# Patient Record
Sex: Male | Born: 1995 | Race: Black or African American | Hispanic: No | Marital: Single | Smoking: Current every day smoker
Health system: Southern US, Community
[De-identification: ages and names within clinical notes are randomized; demographics above are authoritative.]

## PROBLEM LIST (undated history)

## (undated) DIAGNOSIS — R45851 Suicidal ideations: Secondary | ICD-10-CM

## (undated) DIAGNOSIS — F29 Unspecified psychosis not due to a substance or known physiological condition: Secondary | ICD-10-CM

---
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
## 2015-01-30 ENCOUNTER — Emergency Department (HOSPITAL_COMMUNITY)
Admission: EM | Admit: 2015-01-30 | Discharge: 2015-01-30 | Disposition: A | Discharge status: Home / Self Care | Payer: Self-pay | Attending: Emergency Medicine | Admitting: Emergency Medicine

## 2015-01-30 ENCOUNTER — Emergency Department (HOSPITAL_COMMUNITY): Payer: Self-pay

## 2015-01-30 ENCOUNTER — Encounter (HOSPITAL_COMMUNITY): Payer: Self-pay | Admitting: *Deleted

## 2015-01-30 DIAGNOSIS — Y9289 Other specified places as the place of occurrence of the external cause: Secondary | ICD-10-CM | POA: Insufficient documentation

## 2015-01-30 DIAGNOSIS — S63501A Unspecified sprain of right wrist, initial encounter: Secondary | ICD-10-CM | POA: Insufficient documentation

## 2015-01-30 DIAGNOSIS — Z720 Tobacco use: Secondary | ICD-10-CM | POA: Insufficient documentation

## 2015-01-30 DIAGNOSIS — Y9389 Activity, other specified: Secondary | ICD-10-CM | POA: Insufficient documentation

## 2015-01-30 DIAGNOSIS — Y998 Other external cause status: Secondary | ICD-10-CM | POA: Insufficient documentation

## 2015-01-30 DIAGNOSIS — Z792 Long term (current) use of antibiotics: Secondary | ICD-10-CM | POA: Insufficient documentation

## 2015-01-30 DIAGNOSIS — W009XXA Unspecified fall due to ice and snow, initial encounter: Secondary | ICD-10-CM | POA: Insufficient documentation

## 2015-01-30 MED ORDER — HYDROCODONE-ACETAMINOPHEN 5-325 MG PO TABS: ORAL_TABLET | ORAL | Status: DC

## 2015-01-30 NOTE — ED Notes (Signed)
Pt c/o R wrist pain after slipping on ice last night. Reports catching himself with his wrist hyperextended. Swelling noted to R wrist. CMS intact; strong radial pulses

## 2015-01-30 NOTE — ED Provider Notes (Signed)
CSN: [id number]     Arrival date & time 01/30/15  1804 History  This chart was scribed for Wynetta Emery, PA-C, working with Glynn Octave, MD by Chestine Spore, ED Scribe. The patient was seen in room TR09C/TR09C at 6:39 PM.    Chief Complaint  Patient presents with  . Wrist Injury     The history is provided by the patient. No language interpreter was used.    HPI Comments: David [name] is a 19 y.o. male who presents to the Emergency Department complaining of right wrist injury onset last night. He reports that he fell backwards on the ice and he caught himself with his wrist. He reports that he is left handed. He notes that when he moves his fingers there is pain in his wrist. He reports that he is not able to fully extend and rotate his wrist without pain. He notes that when he balls his hand there is pressure in his knuckles. He states that the pain will wake him up at night if he accidentally bumps it.  He states that he has tried acetophenomen with no relief for his symptoms. He denies joint swelling and any other symptoms. He denies being allergic to any medications.    History reviewed. No pertinent past medical history. History reviewed. No pertinent past surgical history. No family history on file. History  Substance Use Topics  . Smoking status: Current Every Day Smoker  . Smokeless tobacco: Not on file  . Alcohol Use: Yes    Review of Systems  A complete 10 system review of systems was obtained and all systems are negative except as noted in the HPI and PMH.    Allergies  Review of patient's allergies indicates no known allergies.  Home Medications   Prior to Admission medications   Medication Sig Start Date End Date Taking? Authorizing Provider  cephALEXin (KEFLEX) 500 MG capsule Take 1 capsule (500 mg total) by mouth 3 (three) times daily. 05/25/13   Arley Phenix, MD   BP 112/71 mmHg  Pulse 63  Temp(Src) 98.6 F (37 C) (Oral)  Resp 18  SpO2  100%  Physical Exam  Constitutional: He is oriented to person, place, and time. He appears well-developed and well-nourished. No distress.  HENT:  Head: Normocephalic and atraumatic.  Eyes: EOM are normal.  Neck: Neck supple. No tracheal deviation present.  Cardiovascular: Normal rate.   Pulmonary/Chest: Effort normal. No respiratory distress.  Musculoskeletal: Normal range of motion. He exhibits no edema or tenderness.  Right wrist with no deformity or swelling, no snuffbox tenderness, excellent range of motion, distally more neurovascularly intact, no overlying skin changes.  Neurological: He is alert and oriented to person, place, and time.  Skin: Skin is warm and dry.  Psychiatric: He has a normal mood and affect. His behavior is normal.  Nursing note and vitals reviewed.   ED Course  Procedures (including critical care time)  SPLINT APPLICATION Date/Time: 2:20 AM Authorized by: Wynetta Emery Consent: Verbal consent obtained. Risks and benefits: risks, benefits and alternatives were discussed Consent given by: patient Splint applied by: RN Location details: right wrist Splint type: thumb Spica Supplies used: Pre-fab splint Post-procedure: The splinted body part was neurovascularly unchanged following the procedure. Patient tolerance: Patient tolerated the procedure well with no immediate complications.    DIAGNOSTIC STUDIES: Oxygen Saturation is 100% on room air, normal by my interpretation.    COORDINATION OF CARE: 6:43 PM-Discussed treatment plan which includes Ice, motrin, Sling, Right wrist  X-ray, Norco with pt at bedside and pt agreed to plan.   Labs Review Labs Reviewed - No data to display  Imaging Review Dg Wrist Complete Right  01/30/2015   CLINICAL DATA:  Ulnar and radial right wrist pain and and swelling in the region of the first metacarpal after stepping on wet concrete stairs and falling with his weight on his right hand last night.  EXAM: RIGHT  WRIST - COMPLETE 3+ VIEW  COMPARISON:  None.  FINDINGS: There is no evidence of fracture or dislocation. There is no evidence of arthropathy or other focal bone abnormality. Soft tissues are unremarkable.  IMPRESSION: Normal examination.   Electronically Signed   By: Beckie SaltsSteven  Reid M.D.   On: 01/30/2015 19:13     EKG Interpretation None      MDM   Final diagnoses:  Right wrist sprain, initial encounter    Filed Vitals:   01/30/15 1808 01/30/15 1928  BP: 112/71 125/58  Pulse: 63 70  Temp: 98.6 F (37 C) 98.6 F (37 C)  TempSrc: Oral Oral  Resp: 18 16  SpO2: 100% 100%    Medications - No data to display  Daisey MustCameron M Catalano is a pleasant 19 y.o. male presenting with wrist pain status post slip and fall last night. Neurovascularly intact with mild tenderness to palpation. X-rays negative. Will treat with rest, ice, compression elevation, NSAIDS.   Evaluation does not show pathology that would require ongoing emergent intervention or inpatient treatment. Pt is hemodynamically stable and mentating appropriately. Discussed findings and plan with patient/guardian, who agrees with care plan. All questions answered. Return precautions discussed and outpatient follow up given.   Discharge Medication List as of 01/30/2015  7:22 PM    START taking these medications   Details  HYDROcodone-acetaminophen (NORCO/VICODIN) 5-325 MG per tablet Take 1-2 tablets by mouth every 6 hours as needed for pain., Print          I personally performed the services described in this documentation, which was scribed in my presence. The recorded information has been reviewed and is accurate.    Wynetta Emeryicole Niall Illes, PA-C 01/31/15 0221  Glynn OctaveStephen Rancour, MD 01/31/15 [phone]

## 2015-01-30 NOTE — Discharge Instructions (Signed)
Rest, Ice intermittently (in the first 24-48 hours), Gentle compression with an Ace wrap, and elevate (Limb above the level of the heart)   Take up to 800mg  of ibuprofen (that is usually 4 over the counter pills)  3 times a day for 5 days. Take with food.  Take vicodin for breakthrough pain, do not drink alcohol, drive, care for children or do other critical tasks while taking vicodin.  Only use the arm sling for up to 2 days. Take the arm out and rotate the shoulder every 4 hours.   Please follow with your primary care doctor in the next 2 days for a check-up. They must obtain records for further management.   Do not hesitate to return to the Emergency Department for any new, worsening or concerning symptoms.   Sprain A sprain is a tear in one of the strong, fibrous tissues that connect your bones (ligaments). The severity of the sprain depends on how much of the ligament is torn. The tear can be either partial or complete. CAUSES  Often, sprains are a result of a fall or an injury. The force of the impact causes the fibers of your ligament to stretch beyond their normal length. This excess tension causes the fibers of your ligament to tear. SYMPTOMS  You may have some loss of motion or increased pain within your normal range of motion. Other symptoms include:  Bruising.  Tenderness.  Swelling. DIAGNOSIS  In order to diagnose a sprain, your caregiver will physically examine you to determine how torn the ligament is. Your caregiver may also suggest an X-ray exam to make sure no bones are broken. TREATMENT  If your ligament is only partially torn, treatment usually involves keeping the injured area in a fixed position (immobilization) for a short period. To do this, your caregiver will apply a bandage, cast, or splint to keep the area from moving until it heals. For a partially torn ligament, the healing process usually takes 2 to 3 weeks. If your ligament is completely torn, you may need  surgery to reconnect the ligament to the bone or to reconstruct the ligament. After surgery, a cast or splint may be applied and will need to stay on for 4 to 6 weeks while your ligament heals. HOME CARE INSTRUCTIONS  Keep the injured area elevated to decrease swelling.  To ease pain and swelling, apply ice to your joint twice a day, for 2 to 3 days.  Put ice in a plastic bag.  Place a towel between your skin and the bag.  Leave the ice on for 15 minutes.  Only take over-the-counter or prescription medicine for pain as directed by your caregiver.  Do not leave the injured area unprotected until pain and stiffness go away (usually 3 to 4 weeks).  Do not allow your cast or splint to get wet. Cover your cast or splint with a plastic bag when you shower or bathe. Do not swim.  Your caregiver may suggest exercises for you to do during your recovery to prevent or limit permanent stiffness. SEEK IMMEDIATE MEDICAL CARE IF:  Your cast or splint becomes damaged.  Your pain becomes worse. MAKE SURE YOU:  Understand these instructions.  Will watch your condition.  Will get help right away if you are not doing well or get worse. Document Released: 11/27/2000 Document Revised: 02/22/2012 Document Reviewed: 12/12/2011 [facility] Patient Information 2015 [place] [place]. This information is not intended to replace advice given to you by your health care provider.  Make sure you discuss any questions you have with your health care provider. ° °

## 2015-01-30 NOTE — ED Notes (Signed)
The pt is c/o rt wrist pain  He fell on the ice last pm  He fell onto his wrist sle swollen

## 2015-07-25 ENCOUNTER — Emergency Department (INDEPENDENT_AMBULATORY_CARE_PROVIDER_SITE_OTHER)
Admission: EM | Admit: 2015-07-25 | Discharge: 2015-07-25 | Disposition: A | Discharge status: Home / Self Care | Payer: Self-pay | Source: Home / Self Care | Attending: Family Medicine | Admitting: Family Medicine

## 2015-07-25 ENCOUNTER — Encounter (HOSPITAL_COMMUNITY): Payer: Self-pay | Admitting: Emergency Medicine

## 2015-07-25 DIAGNOSIS — J301 Allergic rhinitis due to pollen: Secondary | ICD-10-CM

## 2015-07-25 DIAGNOSIS — R0982 Postnasal drip: Secondary | ICD-10-CM

## 2015-07-25 DIAGNOSIS — R638 Other symptoms and signs concerning food and fluid intake: Secondary | ICD-10-CM

## 2015-07-25 DIAGNOSIS — R531 Weakness: Secondary | ICD-10-CM

## 2015-07-25 NOTE — ED Notes (Signed)
C/o sinus States he has a fever, stuffy nose, congestion, ear fullness, headache and vomiting States he has eye pressure Does have yellow mucous congestion Does need doctors note

## 2015-07-25 NOTE — Discharge Instructions (Signed)
Allergic Rhinitis Allegra or Zyrtec daily for drainage Sudafed PE 10 mg every 4 hours for congestion Flonase nasal spray daily Allergic rhinitis is when the mucous membranes in the nose respond to allergens. Allergens are particles in the air that cause your body to have an allergic reaction. This causes you to release allergic antibodies. Through a chain of events, these eventually cause you to release histamine into the blood stream. Although meant to protect the body, it is this release of histamine that causes your discomfort, such as frequent sneezing, congestion, and an itchy, runny nose.  CAUSES  Seasonal allergic rhinitis (hay fever) is caused by pollen allergens that may come from grasses, trees, and weeds. Year-round allergic rhinitis (perennial allergic rhinitis) is caused by allergens such as house dust mites, pet dander, and mold spores.  SYMPTOMS   Nasal stuffiness (congestion).  Itchy, runny nose with sneezing and tearing of the eyes. DIAGNOSIS  Your health care provider can help you determine the allergen or allergens that trigger your symptoms. If you and your health care provider are unable to determine the allergen, skin or blood testing may be used. TREATMENT  Allergic rhinitis does not have a cure, but it can be controlled by:  Medicines and allergy shots (immunotherapy).  Avoiding the allergen. Hay fever may often be treated with antihistamines in pill or nasal spray forms. Antihistamines block the effects of histamine. There are over-the-counter medicines that may help with nasal congestion and swelling around the eyes. Check with your health care provider before taking or giving this medicine.  If avoiding the allergen or the medicine prescribed do not work, there are many new medicines your health care provider can prescribe. Stronger medicine may be used if initial measures are ineffective. Desensitizing injections can be used if medicine and avoidance does not work.  Desensitization is when a patient is given ongoing shots until the body becomes less sensitive to the allergen. Make sure you follow up with your health care provider if problems continue. HOME CARE INSTRUCTIONS It is not possible to completely avoid allergens, but you can reduce your symptoms by taking steps to limit your exposure to them. It helps to know exactly what you are allergic to so that you can avoid your specific triggers. SEEK MEDICAL CARE IF:   You have a fever.  You develop a cough that does not stop easily (persistent).  You have shortness of breath.  You start wheezing.  Symptoms interfere with normal daily activities. Document Released: 08/25/2001 Document Revised: 12/05/2013 Document Reviewed: 08/07/2013 [facility] Patient Information 2015 [place], [place]. This information is not intended to replace advice given to you by your health care provider. Make sure you discuss any questions you have with your health care provider.  Weakness Weakness is a lack of strength. It may be felt all over the body (generalized) or in one specific part of the body (focal). Some causes of weakness can be serious. You may need further medical evaluation, especially if you are elderly or you have a history of immunosuppression (such as chemotherapy or HIV), kidney disease, heart disease, or diabetes. CAUSES  Weakness can be caused by many different things, including:  Infection.  Physical exhaustion.  Internal bleeding or other blood loss that results in a lack of red blood cells (anemia).  Dehydration. This cause is more common in elderly people.  Side effects or electrolyte abnormalities from medicines, such as pain medicines or sedatives.  Emotional distress, anxiety, or depression.  Circulation problems, especially severe  peripheral arterial disease.  Heart disease, such as rapid atrial fibrillation, bradycardia, or heart failure.  Nervous system disorders, such as  Guillain-Barr syndrome, multiple sclerosis, or stroke. DIAGNOSIS  To find the cause of your weakness, your caregiver will take your history and perform a physical exam. Lab tests or X-rays may also be ordered, if needed. TREATMENT  Treatment of weakness depends on the cause of your symptoms and can vary greatly. HOME CARE INSTRUCTIONS   Rest as needed.  Eat a well-balanced diet.  Try to get some exercise every day.  Only take over-the-counter or prescription medicines as directed by your caregiver. SEEK MEDICAL CARE IF:   Your weakness seems to be getting worse or spreads to other parts of your body.  You develop new aches or pains. SEEK IMMEDIATE MEDICAL CARE IF:   You cannot perform your normal daily activities, such as getting dressed and feeding yourself.  You cannot walk up and down stairs, or you feel exhausted when you do so.  You have shortness of breath or chest pain.  You have difficulty moving parts of your body.  You have weakness in only one area of the body or on only one side of the body.  You have a fever.  You have trouble speaking or swallowing.  You cannot control your bladder or bowel movements.  You have black or bloody vomit or stools. MAKE SURE YOU:  Understand these instructions.  Will watch your condition.  Will get help right away if you are not doing well or get worse. Document Released: 11/30/2005 Document Revised: 05/31/2012 Document Reviewed: 01/29/2012 [facility] Patient Information 2015 [place], [place]. This information is not intended to replace advice given to you by your health care provider. Make sure you discuss any questions you have with your health care provider.

## 2015-07-25 NOTE — ED Provider Notes (Signed)
CSN: [id number]     Arrival date & time 07/25/15  1259 History   First MD Initiated Contact with Patient 07/25/15 1319     Chief Complaint  Patient presents with  . Sinusitis   (Consider location/radiation/quality/duration/timing/severity/associated sxs/prior Treatment) HPI Comments: 19 year old male states that he had a subjective fever last p.m. He states he felt extremely hot but did not take his temperature. Nor did he take any medicine for perceived fever. He has had a dry cough, has vomited twice in the past 24 hours, as had a headache, eyes over the frontal bitemporal areas. Yesterday while at graduation he was feeling weak and had blurred vision and was difficult for him to stand up. He states that he now has a headache his eyes are. The days prior to yesterday he has been working in a hot environment performing repetitive task. It is questionable as to whether he has been keeping up with his fluid intake.   History reviewed. No pertinent past medical history. History reviewed. No pertinent past surgical history. History reviewed. No pertinent family history. Social History  Substance Use Topics  . Smoking status: Current Every Day Smoker  . Smokeless tobacco: None  . Alcohol Use: Yes    Review of Systems  Constitutional: Positive for fever, chills, activity change and fatigue.  HENT: Positive for congestion. Negative for ear pain, postnasal drip, rhinorrhea, sore throat and trouble swallowing.   Eyes: Negative for photophobia.       Pain behind the eyes. No problems with vision.  Respiratory: Positive for cough. Negative for choking, chest tightness, shortness of breath and wheezing.   Cardiovascular: Negative for chest pain.       Sensation of heart racing at times.  Gastrointestinal: Positive for vomiting. Negative for abdominal pain.  Genitourinary: Negative.   Musculoskeletal: Negative.   Neurological: Positive for dizziness, weakness and headaches. Negative for syncope.   Psychiatric/Behavioral: Negative.     Allergies  Review of patient's allergies indicates no known allergies.  Home Medications   Prior to Admission medications   Medication Sig Start Date End Date Taking? Authorizing Provider  cephALEXin (KEFLEX) 500 MG capsule Take 1 capsule (500 mg total) by mouth 3 (three) times daily. 05/25/13   Marcellina Millin, MD  HYDROcodone-acetaminophen (NORCO/VICODIN) 5-325 MG per tablet Take 1-2 tablets by mouth every 6 hours as needed for pain. 01/30/15   Nicole Pisciotta, PA-C   BP 116/73 mmHg  Pulse 97  Temp(Src) 99.7 F (37.6 C) (Oral)  Resp 18  SpO2 96% Physical Exam  Constitutional: He is oriented to person, place, and time. He appears well-developed and well-nourished. No distress.  Patient does not appear toxic or especially ill. His movements and speech are energetic and strong. Well coordinated.  HENT:  Head: Normocephalic and atraumatic.  Bilateral TMs are normal Oropharynx with minor erythema, copious amount of clear PND and much cobblestoning. No exudates. Airway widely patent.  Eyes: Conjunctivae and EOM are normal. Pupils are equal, round, and reactive to light.  Neck: Normal range of motion. Neck supple.  Cardiovascular: Normal rate, regular rhythm, normal heart sounds and intact distal pulses.   Pulmonary/Chest: Effort normal and breath sounds normal. No respiratory distress. He has no wheezes. He has no rales.  Abdominal: Soft. He exhibits no distension. There is no tenderness.  Musculoskeletal: Normal range of motion. He exhibits no edema or tenderness.  Lymphadenopathy:    He has no cervical adenopathy.  Neurological: He is alert and oriented to person, place, and time.  He exhibits normal muscle tone.  Skin: Skin is warm and dry. He is not diaphoretic.  Psychiatric: He has a normal mood and affect.  Nursing note and vitals reviewed.   ED Course  Procedures (including critical care time) Labs Review Labs Reviewed - No data to  display  Imaging Review No results found.   MDM   1. Allergic rhinitis due to pollen   2. PND (post-nasal drip)   3. Symptoms of dehydration   4. Weakness    think most of this is due to allergic rhinitis symptoms, congestion and non-bacterial sinusitis. Also working in a hot environment and becoming dehydrated without sufficient rehydration. We will give him a couple days off and have him rest, drink any fluids intake medicines for his congestion and drainage.  Allegra or Zyrtec daily for drainage Sudafed PE 10 mg every 4 hours for congestion Flonase nasal spray daily Drink lots of fluids, gatorade, water, rest, stay in cool environment. Ibuprofen or Tylenol for subjective fevers or headache or pressure in the face.     Hayden Rasmussen, NP 07/25/15 1353

## 2015-10-18 ENCOUNTER — Encounter (HOSPITAL_COMMUNITY): Payer: Self-pay

## 2015-10-18 ENCOUNTER — Emergency Department (HOSPITAL_COMMUNITY): Payer: Self-pay

## 2015-10-18 ENCOUNTER — Emergency Department (HOSPITAL_COMMUNITY)
Admission: EM | Admit: 2015-10-18 | Discharge: 2015-10-18 | Disposition: A | Discharge status: Home / Self Care | Payer: Self-pay | Attending: Emergency Medicine | Admitting: Emergency Medicine

## 2015-10-18 DIAGNOSIS — Z720 Tobacco use: Secondary | ICD-10-CM | POA: Insufficient documentation

## 2015-10-18 DIAGNOSIS — M24419 Recurrent dislocation, unspecified shoulder: Secondary | ICD-10-CM

## 2015-10-18 DIAGNOSIS — M24411 Recurrent dislocation, right shoulder: Secondary | ICD-10-CM | POA: Insufficient documentation

## 2015-10-18 NOTE — Discharge Instructions (Signed)
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 See orthopedic surgery within 3 days for close follow-up.  Take ibuprofen as needed for pain.If any symptoms worsen come back to the emergency department immediately. Thank you. Anterior shoulder instability is a condition that is characterized by recurrent dislocation of the shoulder joint. Dislocation is an injury in which two adjacent bones are no longer in proper alignment, and the joint surfaces are no longer touching. Subluxation is a similar injury to dislocation; however, the joint surfaces are still touching. Dislocations and subluxations of the shoulder joint (glenohumeral) most commonly involve the upper arm bone (humerus) displacing forward (anteriorly). The shoulder joint allows more motion than any other joint in the body, and because of this it is highly susceptible to injury. When the glenohumeral joint is dislocated or subluxated, the muscles that control the shoulder joint (rotator cuff) tendons become stretched. Repetitive injury results in the shoulder joint becoming loose and results in instability of the shoulder joint. These injuries may also cause a tear in the cartilage that lines the joint and helps keep the humerus head in place (labrum). SYMPTOMS   Severe shoulder pain when the joint is dislocated or subluxated.  Shoulder weakness, pain, and/or inflammation.  Loss of shoulder function.  Pain that worsens with shoulder function, especially motions that involve arm movements above shoulder height.  Feeling of shoulder weakness or instability.  Signs of nerve damage: numbness or paralysis.  Crackling (crepitation) feeling and sound when the injured area is touched or with shoulder motion. CAUSES  Anterior shoulder instability is caused by injury to the glenohumeral joint that causes it to become dislocated or subluxated. Common mechanisms of injury  include:  Direct trauma to the shoulder joint.  Repetitive and/or strenuous movements of the shoulder joint, especially those with the arm above shoulder height.  Sprain of one of the ligaments of the shoulder joint.  An abnormality you are born with (congenital). This includes a shallow or malformed joint surface. RISK INCREASES WITH:  Contact sports (football, wrestling, and basketball).  Activities that involve repetitive and/or strenuous movements of the shoulder joint, especially those with the arm above shoulder height (baseball, volleyball, or swimming).  Previous shoulder injury.  Poor strength and flexibility. PREVENTION  Warm up and stretch properly before activity.  Allow for adequate recovery between workouts.  Maintain physical fitness:  Strength, flexibility, and endurance.  Cardiovascular fitness.  Learn and use proper technique. When possible, have a coach correct improper technique.  Wear properly fitted and padded protective equipment. PROGNOSIS  The extent of recovery and likelihood of future dislocations and subluxations depends on the extent of damage done to the shoulder.  RELATED COMPLICATIONS   Damage to the nervous system or blood vessels that may cause weakness, paralysis, numbness, coldness, and paleness.  Damage to the bones or cartilage of the shoulder joint.  Permanent shoulder instability.  Tear of one or more of the rotator cuff tendons.  Arthritis of the shoulder. TREATMENT When the shoulder joint is dislocated it must be realigned (reduced) by someone who is trained in the procedure. Occasionally reduction cannot be performed manually, and requires surgery. After reduction, the use of ice and medication may help reduce pain and inflammation. The shoulder should be immobilized with a sling for 3 to 8 weeks to allow the joint to heal. After immobilization, it is important to perform strengthening and stretching exercises to help regain  strength and  a full range of motion. These exercises may be completed at home or with a therapist. Surgery is reserved for individuals who have sustained multiple shoulder dislocations due to shoulder instability. MEDICATION   General anesthesia or muscle relaxants may be necessary for reduction of the shoulder joint.  If pain medication is necessary, then nonsteroidal anti-inflammatory medications, such as aspirin and ibuprofen, or other minor pain relievers, such as acetaminophen, are often recommended.  Do not take pain medication within 7 days before surgery.  Prescription pain relievers may be given if deemed necessary by your caregiver. Use only as directed and only as much as you need. COLD THERAPY  Cold treatment (icing) relieves pain and reduces inflammation. Cold treatment should be applied for 10 to 15 minutes every 2 to 3 hours for inflammation and pain and immediately after any activity that aggravates your symptoms. Use ice packs or an ice massage. SEEK MEDICAL CARE IF:  Treatment seems to offer no benefit, or the condition worsens.  Any medications produce adverse side effects.  Any complications from surgery occur:  Pain, numbness, or coldness in the extremity operated upon.  Discoloration of the nail beds (they become blue or gray) of the extremity operated upon.  Signs of infections (fever, pain, inflammation, redness, or persistent bleeding). EXERCISES RANGE OF MOTION (ROM) AND STRETCHING EXERCISES - Shoulder Instability, Anterior These exercises may help you restore your shoulder mobility once your physician has discontinued your 2-6 weeks of immobilization. Beginning these before your provider's approval may result in delayed healing. Your symptoms may resolve with or without further involvement from your physician, physical therapist or athletic trainer. While completing these exercises, remember:   Restoring tissue flexibility helps normal motion to return to the  joints. This allows healthier, less painful movement and activity.  An effective stretch should be held for at least 30 seconds. A stretch should never be painful. You should only feel a gentle lengthening or release in the stretch.  During your recovery, avoid activity or exercises which involve actions that place your right / left hand or elbow above your head or behind your back or head. These positions stress the tissues which are trying to heal. ROM - Pendulum  Bend at the waist so that your right / left arm falls away from your body. Support yourself with your opposite hand on a solid surface, such as a table or a countertop.  Your right / left arm should be perpendicular to the ground. If it is not perpendicular, you need to lean over farther. Relax the muscles in your right / left arm and shoulder as much as possible.  Gently sway your hips and trunk so they move your arm without any use of your right / left shoulder muscles.  Progress your movements so that your arm moves side to side, then forward and backward, and finally, both clockwise and counterclockwise.  Complete __________ repetitions in each direction. Many people use this exercise to relieve discomfort in their shoulder as well as to gain range of motion. Repeat __________ times. Complete this exercise __________ times per day. STRETCH - Flexion, Seated  Sit in a firm chair so that your right / left forearm can rest on a table or on a table or countertop. Your elbow should rest below the height of your shoulder so that your shoulder feels supported and not tense or uncomfortable.  Keeping your right / left shoulder relaxed, lean forward at your waist, allowing your hand to slide forward. Bend forward  until you feel a moderate stretch in your shoulder, but before you feel an increase in your pain.  Hold __________ seconds. Slowly return to your starting position. Repeat __________ times. Complete this exercise __________  times per day.  STRETCH - Flexion, Standing  Stand with good posture. With an underhand grip on your right / left hand and an overhand grip on the opposite hand, grasp a broomstick or cane so that your hands are a little more than shoulder-width apart.  Keeping your right / left elbow straight and shoulder muscles relaxed, push the stick with your opposite hand to raise your arm in front of your body and then overhead. Raise your arm until you feel a stretch in your right / left shoulder, but before you have increased shoulder pain.  Try to avoid shrugging your shoulder as your arm rises by keeping your shoulder blade tucked down and toward your mid-back spine. Hold __________ seconds.  Slowly return to the starting position. Repeat __________ times. Complete this exercise __________ times per day.  STRETCH - Abduction, Supine  Stand with good posture. With an underhand grip on your right / left hand and an overhand grip on the opposite hand, grasp a broomstick or cane so that your hands are a little more than shoulder-width apart.  Keeping your right / left elbow straight and shoulder muscles relaxed, push the stick with your opposite hand to raise your right / left arm out to the side of your body and then overhead. Raise your arm until you feel a stretch in your shoulder, but before you have increased shoulder pain.  Try to avoid shrugging your shoulder as your arm rises by keeping your shoulder blade tucked down and toward your mid-back spine. Hold __________ seconds.  Slowly return to the starting position. Repeat __________ times. Complete this exercise __________ times per day.  ROM - Flexion, Active-Assisted  Lie on your back. You may bend your knees for comfort.  Grasp a broomstick or cane so your hands are about shoulder-width apart. Your right / left hand should grip the end of the stick/cane so that your hand is positioned "thumbs-up," as if you were about to shake  hands.  Using your healthy arm to lead, raise your right / left arm overhead until you feel a gentle stretch in your shoulder. Hold __________ seconds.  Use the stick/cane to assist in returning your arm to its starting position. Repeat __________ times. Complete this exercise __________ times per day.  STRETCH - Flexion, Standing  Stand facing a wall. Walk your right / left fingers up the wall until you feel a moderate stretch in your shoulder. As your hand gets higher, you may need to step closer to the wall or use a door frame to walk through.  Try to avoid shrugging your shoulder as your arm rises by keeping your shoulder blade tucked down and toward your mid-back spine.  Hold __________ seconds. Use your other hand, if needed, to ease out of the stretch and return to the starting position. Repeat __________ times. Complete this exercise __________ times per day.  STRETCH - External Rotation  Tuck a folded towel or small ball under your right / left upper arm. Grasp a broomstick or cane with an underhand grasp a little more than shoulder width apart. Bend your elbows to 90 degrees.  Stand with good posture or sit in a chair without arms.  Use your strong arm to push the stick across your body. Do not  allow the towel or ball to fall. This will rotate your right / left arm away from your abdomen. Using the stick turn/rotate your hand and forearm away from your body. Hold __________ seconds. Repeat __________ times. Complete this exercise __________ times per day.  STRENGTHENING EXERCISES - Shoulder Instability, Anterior These exercises may help you restore your shoulder strength once your physician has discontinued your 2-6 weeks of immobilization. When completing these exercises, do not raise your arm above shoulder-height until your physician, physical therapist or athletic trainer has instructed you to do so. Advancing your exercise before your provider's approval may result in delayed  healing. While completing these exercises, remember:   Muscles can gain both the endurance and the strength needed for everyday activities through controlled exercises.  Complete these exercises as instructed by your physician, physical therapist or athletic trainer. Progress the resistance and repetitions only as guided.  You may experience muscle soreness or fatigue, but the pain or discomfort you are trying to eliminate should never worsen during these exercises. If this pain does worsen, stop and make certain you are following the directions exactly. If the pain is still present after adjustments, discontinue the exercise until you can discuss the trouble with your clinician.  During your recovery, avoid activity or exercises which involve actions that place your right / left hand or elbow above your head or behind your back or head. These positions stress the tissues which are trying to heal. STRENGTH - Scapular Depression and Adduction   With good posture, sit on a firm chair. Supported your arms in front of you with pillows, arm rests or a table top. Have your elbows in line with the sides of your body.  Gently draw your shoulder blades down and toward your mid-back spine. Gradually increase the tension without tensing the muscles along the top of your shoulders and the back of your neck.  Hold for __________ seconds. Slowly release the tension and relax your muscles completely before completing the next repetition.  After you have practiced this exercise, remove the arm support and complete it in standing as well as sitting. Repeat __________ times. Complete this exercise __________ times per day.  STRENGTH - Shoulder Flexion, Isometric  With good posture and facing a wall, stand or sit about 4-6 inches away.  Keeping your right / left elbow straight, gently press the top of your fist into the wall. Increase the pressure gradually until you are pressing as hard as you can without  shrugging your shoulder or increasing any shoulder discomfort.  Hold __________ seconds.  Release the tension slowly. Relax your shoulder muscles completely before you the next repetition. Repeat __________ times. Complete this exercise __________ times per day.  STRENGTH - Shoulder Abductors, Isometric  With good posture, stand or sit about 4-6 inches from a wall with your right / left side facing the wall.  Bend your right / left elbow. Gently press your elbow into the wall. Increase the pressure gradually until you are pressing as hard as you can without shrugging your shoulder or increasing any shoulder discomfort.  Hold __________ seconds.  Release the tension slowly. Relax your shoulder muscles completely before you the next repetition. Repeat __________ times. Complete this exercise __________ times per day.  STRENGTH - Internal Rotators, Isometric  Keep your right / left elbow at your side and bend it 90 degrees.  Step into a door frame so that the inside of your right / left wrist can press against the door  frame without your upper arm leaving your side.  Gently press your right / left wrist into the door frame as if you were trying to draw the palm of your hand to your abdomen. Gradually increase the tension until you are pressing as hard as you can without shrugging your shoulder or increasing any shoulder discomfort.  Hold __________ seconds.  Release the tension slowly. Relax your shoulder muscles completely before you the next repetition. Repeat __________ times. Complete this exercise __________ times per day.  STRENGTH - External Rotators, Isometric  Keep your right / left elbow at your side and bend it 90 degrees.  Step into a door frame so that the outside of your right / left wrist can press against the door frame without your upper arm leaving your side.  Gently press your right / left wrist into the door frame as if you were trying to swing the back of your hand  away from your abdomen. Gradually increase the tension until you are pressing as hard as you can without shrugging your shoulder or increasing any shoulder discomfort.  Hold __________ seconds.  Release the tension slowly. Relax your shoulder muscles completely before you the next repetition. Repeat __________ times. Complete this exercise __________ times per day.  STRENGTH - Scapular Protractors, Standing  Stand arms-length away from a wall. Place your hands on the wall, keeping your elbows straight.  Begin by dropping your shoulder blades down and toward your mid-back spine.  To strengthen your protractors, keep your shoulder blades down, but slide them forward on your rib cage. It will feel as if you are lifting the back of your rib cage away from the wall. This is a subtle motion and can be challenging to complete. Ask your clinician for further instruction if you are not sure you are doing the exercise correctly.  Hold for __________ seconds. Slowly return to the starting position, resting the muscles completely before completing the next repetition. Repeat __________ times. Complete this exercise __________ times per day. STRENGTH - Scapular Protractors, Supine  Lie on your back on a firm surface. Extend your right / left arm straight into the air while holding a __________ weight in your hand.  Keeping your head and back in place, lift your shoulder off the floor.  Hold __________ seconds. Slowly return to the starting position and allow your muscles to relax completely before completing the next repetition. Repeat __________ times. Complete this exercise __________ times per day. STRENGTH - Scapular Protractors, Quadruped  Get onto your hands and knees with your shoulders directly over your hands (or as close as you comfortably can be).  Keeping your elbows locked, lift the back of your rib cage up into your shoulder blades so your mid-back rounds-out. Keep your neck muscles  relaxed.  Hold this position for __________ seconds. Slowly return to the starting position and allow your muscles to relax completely before completing the next repetition. Repeat __________ times. Complete this exercise __________ times per day.  STRENGTH - Shoulder Extensors, Prone  Lie on your stomach on a firm surface so that your right / left arm overhangs the edge. Rest your forehead on your opposite forearm. With your thumb facing away from your body and your elbow straight, hold a __________ weight in your hand.  Squeeze your right / left shoulder blade to your mid-back spine and then slowly raise your arm behind you to the height of the bed.  Hold for __________ seconds. Slowly reverse the directions and  return to the starting position, controlling the weight as you lower your arm. Repeat __________ times. Complete this exercise __________ times per day.  STRENGTH - Shoulder Flexion  Stand or sit with good posture. Grasp a __________ weight or an exercise band/tubing so that your hand is "thumbs-up," like when you shake hands.  Slowly lift your right / left arm as far as you can without increasing any shoulder pain. Initially, many people can only raise their hand to shoulder height.  Avoid shrugging your shoulder as your arm rises by keeping your shoulder blade tucked down and toward your mid-back spine.  Hold for __________ seconds. Control the descent of your hand as you slowly return to your starting position. Repeat __________ times. Complete this exercise __________ times per day.  STRENGTH - Scapular Retractors  Secure a rubber exercise band/tubing so that it is at the height of your shoulders when you are either standing or sitting on a firm arm-less chair.  With a palm-down grip, grasp an end of the band/tubing in each hand. Straighten your elbows and lift your hands straight in front of you at shoulder height. Step back away from the secured end of band/tubing until it  becomes tense.  Squeezing your shoulder blades together, draw your elbows back as you bend them. Keep your upper arm lifted away from your body throughout the exercise.  Hold __________ seconds. Slowly ease the tension on the band/tubing as you reverse the directions and return to the starting position. Repeat __________ times. Complete this exercise __________ times per day. STRENGTH - Internal Rotators  Secure a rubber exercise band/tubing to a fixed object so that it is at the same height as your right / left elbow when you are standing or sitting on a firm surface.  Stand or sit so that the secured exercise band/tubing is at your right / left side.  Bend your elbow 90 degrees. Place a folded towel or small pillow under your right / left arm so that your elbow is a few inches away from your side.  Keeping the tension on the exercise band/tubing, pull it across your body toward your abdomen. Be sure to keep your body steady so that the movement is only coming from your shoulder rotating.  Hold __________ seconds. Release the tension in a controlled manner as you return to the starting position. Repeat __________ times. Complete this exercise __________ times per day.  STRENGTH - External Rotators  Secure a rubber exercise band/tubing to a fixed object so that it is at the same height as your right / left elbow when you are standing or sitting on a firm surface.  Stand or sit so that the secured exercise band/tubing is at your side that is not injured.  Bend your elbow 90 degrees. Place a folded towel or small pillow under your right / left arm so that your elbow is a few inches away from your side.  Keeping the tension on the exercise band/tubing, pull it away from your body, as if pivoting on your elbow. Be sure to keep your body steady so that the movement is only coming from your shoulder rotating.  Hold __________ seconds. Release the tension in a controlled manner as you return to  the starting position. Repeat __________ times. Complete this exercise __________ times per day.    This information is not intended to replace advice given to you by your health care provider. Make sure you discuss any questions you have with your health  care provider.   Document Released: 11/30/2005 Document Revised: 08/21/2015 Document Reviewed: 03/14/2009 [employer] Interactive Patient Education [employer]

## 2015-10-18 NOTE — ED Provider Notes (Signed)
CSN: [id number]   Arrival date & time 10/18/15 0159  History  By signing my name below, I, [name], attest that this documentation has been prepared under the direction and in the presence of Tomasita Crumble, MD. Electronically Signed: [name], ED Scribe. 10/18/2015. 2:40 AM.  Chief Complaint  Patient presents with  . Shoulder Injury    HPI The history is provided by the patient. No language interpreter was used.   David [name] is a 19 y.o. male who presents to the Emergency Department complaining of constant right shoulder pain with onset tonight around 8 PM while helping a friend move furniture. He states that the shoulder appeared to be "slouched over" and dislocated. He manipulated the joint and believes that it is back in place but the pain has persisted. He states that both of his shoulders frequently (at least once per month) dislocate. At present he rates the right shoulder pain 7/10 in severity and describes it as aching.   History reviewed. No pertinent past medical history.  History reviewed. No pertinent past surgical history.  No family history on file.  Social History  Substance Use Topics  . Smoking status: Current Every Day Smoker  . Smokeless tobacco: None  . Alcohol Use: Yes     Review of Systems  Musculoskeletal:       Right shoulder pain   10 Systems reviewed and all are negative for acute change except as noted in the HPI. Home Medications   Prior to Admission medications   Not on File    Allergies  Review of patient's allergies indicates no known allergies.  Triage Vitals: BP 141/62 mmHg  Pulse 83  Temp(Src) 97.9 F (36.6 C) (Oral)  Resp 18  SpO2 98%  Physical Exam  Constitutional: He is oriented to person, place, and time. Vital signs are normal. He appears well-developed and well-nourished.  Non-toxic appearance. He does not appear ill. No distress.  HENT:  Head: Normocephalic and atraumatic.  Nose: Nose normal.  Mouth/Throat:  Oropharynx is clear and moist. No oropharyngeal exudate.  Eyes: Conjunctivae and EOM are normal. Pupils are equal, round, and reactive to light. No scleral icterus.  Neck: Normal range of motion. Neck supple. No tracheal deviation, no edema, no erythema and normal range of motion present. No thyroid mass and no thyromegaly present.  Cardiovascular: Normal rate, regular rhythm, S1 normal, S2 normal, normal heart sounds, intact distal pulses and normal pulses.  Exam reveals no gallop and no friction rub.   No murmur heard. Pulses:      Radial pulses are 2+ on the right side, and 2+ on the left side.       Dorsalis pedis pulses are 2+ on the right side, and 2+ on the left side.  Pulmonary/Chest: Effort normal and breath sounds normal. No respiratory distress. He has no wheezes. He has no rhonchi. He has no rales.  Abdominal: Soft. Normal appearance and bowel sounds are normal. He exhibits no distension, no ascites and no mass. There is no hepatosplenomegaly. There is no tenderness. There is no rebound, no guarding and no CVA tenderness.  Musculoskeletal: Normal range of motion. He exhibits no edema or tenderness.  FROM at bilateral shoulders Normal pulses and sensations distally  Lymphadenopathy:    He has no cervical adenopathy.  Neurological: He is alert and oriented to person, place, and time. He has normal strength. No cranial nerve deficit or sensory deficit.  Skin: Skin is warm, dry and intact. No petechiae and  no rash noted. He is not diaphoretic. No erythema. No pallor.  Psychiatric: He has a normal mood and affect. His behavior is normal. Judgment normal.  Nursing note and vitals reviewed.   ED Course  Procedures   DIAGNOSTIC STUDIES: Oxygen Saturation is 98% on RA, normal by my interpretation.    COORDINATION OF CARE: 2:38 AM Discussed treatment plan which includes right shoulder XR with pt at bedside and pt agreed to plan.  Labs Reviewed - No data to display  Imaging Review  Dg Shoulder Right  10/18/2015  CLINICAL DATA:  Injury while moving a couch. Right shoulder gave out. Possible dislocation. EXAM: RIGHT SHOULDER - 2+ VIEW COMPARISON:  None. FINDINGS: There is no evidence of fracture or dislocation. There is no evidence of arthropathy or other focal bone abnormality. Soft tissues are unremarkable. IMPRESSION: Negative. Electronically Signed   By: Burman NievesWilliam  Stevens M.D.   On: 10/18/2015 02:35    I personally reviewed and evaluated these images as a part of my medical decision-making.   MDM   Final diagnoses:  Recurrent shoulder dislocation, unspecified laterality   Patient presents to the ED to ask what he can take for shoulder pain and how to prevent shoulder dislocations.  Education provided and DC instructions include exercises.  In addition, he has been given ortho follow up for possible surgery.  Advised on motrin and tylenol.  He appears well and in NAD.  VS remain within his normal limits and he is safe for DC.   I personally performed the services described in this documentation, which was scribed in my presence. The recorded information has been reviewed and is accurate.     Tomasita CrumbleAdeleke Bernardo Brayman, MD 10/18/15 1524

## 2015-10-18 NOTE — ED Notes (Addendum)
Pt reports right shoulder pain after helping friend move furniture last night around 8pm. Pt states he thought it may had been dislocated at one point but states he thinks he popped it back in place. Hx of shoulder dislocations.

## 2015-10-18 NOTE — ED Notes (Signed)
MD at bedside. 

## 2016-02-28 ENCOUNTER — Emergency Department (HOSPITAL_COMMUNITY)
Admission: EM | Admit: 2016-02-28 | Discharge: 2016-03-03 | Payer: Self-pay | Attending: Emergency Medicine | Admitting: Emergency Medicine

## 2016-02-28 ENCOUNTER — Inpatient Hospital Stay (HOSPITAL_COMMUNITY): Admission: AD | Admit: 2016-02-28 | Payer: Federal, State, Local not specified - Other | Admitting: Psychiatry

## 2016-02-28 DIAGNOSIS — F1210 Cannabis abuse, uncomplicated: Secondary | ICD-10-CM | POA: Insufficient documentation

## 2016-02-28 DIAGNOSIS — R45851 Suicidal ideations: Secondary | ICD-10-CM

## 2016-02-28 DIAGNOSIS — F17200 Nicotine dependence, unspecified, uncomplicated: Secondary | ICD-10-CM | POA: Insufficient documentation

## 2016-02-28 DIAGNOSIS — S0011XA Contusion of right eyelid and periocular area, initial encounter: Secondary | ICD-10-CM | POA: Insufficient documentation

## 2016-02-28 DIAGNOSIS — F322 Major depressive disorder, single episode, severe without psychotic features: Secondary | ICD-10-CM

## 2016-02-28 DIAGNOSIS — F919 Conduct disorder, unspecified: Secondary | ICD-10-CM | POA: Insufficient documentation

## 2016-02-28 DIAGNOSIS — Y998 Other external cause status: Secondary | ICD-10-CM | POA: Insufficient documentation

## 2016-02-28 DIAGNOSIS — Y9289 Other specified places as the place of occurrence of the external cause: Secondary | ICD-10-CM | POA: Insufficient documentation

## 2016-02-28 DIAGNOSIS — Y9389 Activity, other specified: Secondary | ICD-10-CM | POA: Insufficient documentation

## 2016-02-28 DIAGNOSIS — R4689 Other symptoms and signs involving appearance and behavior: Secondary | ICD-10-CM

## 2016-02-28 LAB — COMPREHENSIVE METABOLIC PANEL
ALBUMIN: 5.2 g/dL — AB (ref 3.5–5.0)
ALT: 20 U/L (ref 17–63)
ANION GAP: 12 (ref 5–15)
AST: 25 U/L (ref 15–41)
Alkaline Phosphatase: 49 U/L (ref 38–126)
BILIRUBIN TOTAL: 0.7 mg/dL (ref 0.3–1.2)
BUN: 7 mg/dL (ref 6–20)
CO2: 26 mmol/L (ref 22–32)
Calcium: 9.9 mg/dL (ref 8.9–10.3)
Chloride: 103 mmol/L (ref 101–111)
Creatinine, Ser: 0.91 mg/dL (ref 0.61–1.24)
GFR calc Af Amer: >60 mL/min (ref >60)
GFR calc non Af Amer: >60 mL/min (ref >60)
GLUCOSE: 90 mg/dL (ref 65–99)
POTASSIUM: 3.9 mmol/L (ref 3.5–5.1)
SODIUM: 141 mmol/L (ref 135–145)
TOTAL PROTEIN: 8.8 g/dL — AB (ref 6.5–8.1)

## 2016-02-28 LAB — ETHANOL: Alcohol, Ethyl (B): <5 mg/dL (ref <5)

## 2016-02-28 LAB — CBC
HEMATOCRIT: 45.5 % (ref 39.0–52.0)
Hemoglobin: 15.3 g/dL (ref 13.0–17.0)
MCH: 29 pg (ref 26.0–34.0)
MCHC: 33.6 g/dL (ref 30.0–36.0)
MCV: 86.3 fL (ref 78.0–100.0)
Platelets: 285 10*3/uL (ref 150–400)
RBC: 5.27 MIL/uL (ref 4.22–5.81)
RDW: 12 % (ref 11.5–15.5)
WBC: 9.8 10*3/uL (ref 4.0–10.5)

## 2016-02-28 LAB — SALICYLATE LEVEL: Salicylate Lvl: <4 mg/dL (ref 2.8–30.0)

## 2016-02-28 LAB — ACETAMINOPHEN LEVEL

## 2016-02-28 MED ORDER — FLUOXETINE HCL 10 MG PO CAPS
10.0000 mg | ORAL_CAPSULE | Freq: Every day | ORAL | Status: DC
  Administered 2016-02-28 – 2016-02-29 (×2): 10 mg via ORAL
  Filled 2016-02-28 (×4): qty 1

## 2016-02-28 MED ORDER — NICOTINE 21 MG/24HR TD PT24
21.0000 mg | MEDICATED_PATCH | Freq: Every day | TRANSDERMAL | Status: DC
  Filled 2016-02-28: qty 1

## 2016-02-28 MED ORDER — ACETAMINOPHEN 325 MG PO TABS: 650.0000 mg | ORAL_TABLET | ORAL | Status: DC | PRN

## 2016-02-28 MED ORDER — LORAZEPAM 1 MG PO TABS: 1.0000 mg | ORAL_TABLET | Freq: Three times a day (TID) | ORAL | Status: DC | PRN

## 2016-02-28 MED ORDER — TRAZODONE HCL 50 MG PO TABS
50.0000 mg | ORAL_TABLET | Freq: Every day | ORAL | Status: DC
  Administered 2016-03-02: 50 mg via ORAL
  Filled 2016-02-28: qty 1

## 2016-02-28 MED ORDER — ONDANSETRON HCL 4 MG PO TABS: 4.0000 mg | ORAL_TABLET | Freq: Three times a day (TID) | ORAL | Status: DC | PRN

## 2016-02-28 MED ORDER — HYDROXYZINE HCL 25 MG PO TABS: 25.0000 mg | ORAL_TABLET | Freq: Three times a day (TID) | ORAL | Status: DC | PRN

## 2016-02-28 MED ORDER — ALUM & MAG HYDROXIDE-SIMETH 200-200-20 MG/5ML PO SUSP: 30.0000 mL | ORAL | Status: DC | PRN

## 2016-02-28 NOTE — ED Notes (Signed)
[facility]

## 2016-02-28 NOTE — BH Assessment (Signed)
Received consult. Contacted Art, [facility] Secretary to place tele-machine in room. Tele assessment to commence shortly.   Davina PokeJoVea Mitsuye Schrodt, LCSW Therapeutic Triage Specialist [facility] 02/28/2016 5:16 AM

## 2016-02-28 NOTE — ED Notes (Signed)
Unable to update history  Pt is not answering questions in triage
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
## 2016-02-28 NOTE — ED Notes (Signed)
Pt sleeping at present, easily arouseable to verbal stimuli.  No distress noted at present, Monitoring for safety, Q 15 min checks in effect.

## 2016-02-28 NOTE — Progress Notes (Signed)
[facility] to Dr. Elna BreslowEappen, room 405-2; patient to arrive at 22:00, RN Latricia was informed.  Melbourne Abtsatia Kestrel Mis, LCSWA Disposition staff 02/28/2016 7:16 PM

## 2016-02-28 NOTE — ED Notes (Addendum)
Pt's affect is flat and appears to be depressed and anxious. He is focused on calling his mother to come and get him.  His communication with staff is minimal. He paces between his room and the telephone. Took Prozac that was ordered for him after staring at it for a while. He did not ask any questions about the medication. He is cooperative, but is slow to process or respond to requests.

## 2016-02-28 NOTE — BH Assessment (Signed)
[facility] Assessment Progress Note  The following facilities have been contacted to seek placement for this pt, with results as noted:  Beds available, information sent, decision pending:  [facility]   At capacity:  [facility]  Abie Cheek, MA Triage Specialist [phone]      
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
## 2016-02-28 NOTE — ED Notes (Signed)
BLOOD DRAW DELAYED. PT UNCOOPERATIVE IN TRIAGE. RN NOTIFIED.

## 2016-02-28 NOTE — ED Notes (Signed)
Bed: [id number] Expected date:  Expected time:  Means of arrival:  Comments: Hold for Triage 4
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
## 2016-02-29 LAB — RAPID URINE DRUG SCREEN, HOSP PERFORMED
Amphetamines: NOT DETECTED
Barbiturates: NOT DETECTED
Benzodiazepines: NOT DETECTED
COCAINE: NOT DETECTED
OPIATES: NOT DETECTED
TETRAHYDROCANNABINOL: POSITIVE — AB

## 2016-02-29 NOTE — ED Notes (Signed)
Pt AAO x 3, no distress noted, calm & cooperative at present,  Monitoring for safety, Q 15 min checks in effect.

## 2016-02-29 NOTE — ED Notes (Addendum)
Pt denies cutting self on chest. Skin assessment performed , Theressa MHT present on assessment. No open areas/cuts noted. Julieanne CottonJosephine, NP notified.

## 2016-02-29 NOTE — ED Notes (Signed)
[facility] pending open bed later today.

## 2016-02-29 NOTE — Progress Notes (Signed)
Disposition CSW completed additional patient referrals for the following inpatient psych facilities:  [facility]  Patient has previously been [facility].  Seward SpeckLeo Ivon Roedel [facility] CenterCSW,LCAS Behavioral Health Disposition CSW [phone]

## 2016-02-29 NOTE — ED Notes (Signed)
Pt laying in bed, Pt guarded on approach, forwards little with this nurse, but pleasant. speech soft. Pt denies SI/HI at this time. Will continue to monitor on special checks q 15 mins for safety.

## 2016-02-29 NOTE — ED Notes (Signed)
Pt mother at bedside

## 2016-02-29 NOTE — BH Assessment (Signed)
Patient was reassessed by TTS.   Patient denies SI/HI at this time. Patient denies AVH. Patient was asked about cutting his chest and he states that he does not remember because it "was like six months ago." Patient states that he is depressed.  Patient states that he always has altercations at work because "people make assumptions about the pieces I pick." When asked to clarify, patient states "you would just have to be there." Patient states that people at work talk about him because of the way that he takes staples out of his papers.  Patient states that when he is in the shower he hears people from work talking about him. Patient states "it's weird." Patient does not appear to be responding to internal stimuli but appears to be paranoid.  Patient requested to go home. Patient was reminded that he needs to provide a urine sample.    Consulted with Dr. Jannifer FranklinAkintayo who recommends inpatient treatment at this time.   Davina PokeJoVea Deneisha Dade, LCSW Therapeutic Triage Specialist [facility] 02/29/2016 12:10 PM

## 2016-03-01 ENCOUNTER — Inpatient Hospital Stay (HOSPITAL_COMMUNITY): Admission: AD | Admit: 2016-03-01 | Payer: Federal, State, Local not specified - Other | Admitting: Psychiatry

## 2016-03-01 DIAGNOSIS — R4689 Other symptoms and signs involving appearance and behavior: Secondary | ICD-10-CM | POA: Insufficient documentation

## 2016-03-01 DIAGNOSIS — F6089 Other specific personality disorders: Secondary | ICD-10-CM

## 2016-03-01 NOTE — ED Notes (Addendum)
(

## 2016-03-01 NOTE — ED Notes (Signed)
Pt refusing to take morning medication, MD and NP notified during morning rounds. Pt guarded on approach, forwards little with this nurse.

## 2016-03-01 NOTE — Progress Notes (Signed)
Patient accepted [facility], room 505-2.  Services of Dr. Elna BreslowEappen. Bed to be available later today.  Rosey BathKelly Lake Cinquemani, RN

## 2016-03-01 NOTE — Progress Notes (Signed)
Patient refused 1800 vitals, writer asked client if she could take his vitals two times and client refused. Attending nurse notified.

## 2016-03-01 NOTE — Progress Notes (Signed)
Client refused 1200 vitals by stating "no" when writer asked if she could take his vitals. Attending RN notified.

## 2016-03-01 NOTE — ED Notes (Signed)
Pt resting in bed, withdrawn no distress noted, calm at present. Monitoring for safety, Q 15 min checks in effect.

## 2016-03-01 NOTE — Progress Notes (Signed)
Patient ate about %30 of breakfast, including biscuit and small portion of egg on tray. Notified attending nurse.

## 2016-03-01 NOTE — Consult Note (Signed)
Psychiatric Specialty Exam: Physical Exam  ROS  Blood pressure 99/43, pulse 56, temperature 98 F (36.7 C), temperature source Oral, resp. rate 15, SpO2 100 %.There is no height or weight on file to calculate BMI.  General Appearance: Casual  Eye Contact:: Minimal  Speech: Slow  Volume: Decreased  Mood: Depressed and Dysphoric  Affect: Constricted  Thought Process: Circumstantial  Orientation: Full (Time, Place, and Person)  Thought Content: Negative  Suicidal Thoughts: no answer from patient  Homicidal Thoughts: No  Memory: Immediate; Fair Recent; Fair Remote; Good  Judgement: Impaired  Insight: Lacking  Psychomotor Activity: Decreased  Concentration: Fair  Recall: [employer]  Fund of Knowledge:Good  Language: Good  Akathisia: No  Handed: Right  AIMS (if indicated):    Assets: Communication Skills  ADL's: Intact  Cognition: WNL         Patient was seen in his room this morning covered from head to toe.  He remains dysphoric and would not answer much questions.  His affect is flat and he did not make eye contact with staff.  Nursing staff is reporting that he is not taking his medications, not eating or drinking.  We will continue to seek inpatient placement. Severe major depression, single episode, without psychotic features (HCC)   Plan:  Continue plan of care, offer medications, fluids and meal, seek inpatient hospitalization.  Dahlia ByesJosephine Onuoha   PMHNP-BC Patient seen face-to-face for psychiatric evaluation, chart reviewed and case discussed with the physician extender and developed treatment plan. Reviewed the information documented and agree with the treatment plan. Thedore MinsMojeed Deane Melick, MD

## 2016-03-01 NOTE — ED Notes (Signed)
Pt noted in room, sitting up, eating dinner.

## 2016-03-01 NOTE — ED Notes (Signed)
Pt isolating himself in room, will not respond to this nurse on approach at this time.
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
## 2016-03-02 NOTE — Progress Notes (Signed)
CSW Sp[facility] with [facility] She states that the patient has been accepted to their facility and is welcomed to come. However, she request that nurse report not to be called until law enforcement is present to transport patient.   Also, she states that they cannot take patient's between the hours of 11:00pm and 6:00am. CSW reached out to sheriff to inquire if he would be able to transport patient tonight. However, he states that he cannot transport patient tonight. He states that someone will be able to transport patient first thing in the morning.   [facility]  [facility]  Bed: 239-2  Accepting Physician: Dr.Komissarova  Nurse Report Number: [phone] [phone]  Trish MageBrittney Amed Datta, LCSWA [phone] ED CSW 03/02/2016 7:09 PM

## 2016-03-02 NOTE — BH Assessment (Signed)
[facility] Assessment Progress Note Reassessed pt who was sleeping at the time Clinical research associatewriter entered his room. Pt did not verbalize much, but only nodded and shook his head to respond. He indicated that he did not sleep well, but was eating ok, and denied SI, HI, AVH. He refused to verbalize regarding why he is here and why he was arguing with his brothers per intake note.  Pt appeared drowsy, but made good eye contact, had normal movements, and there was no evidence of responding to internal stimuli.  TTS continues to seek placement.

## 2016-03-02 NOTE — BH Assessment (Signed)
[facility] Assessment Progress Note  The following facilities have been contacted to seek placement for this pt, with results as noted:  Beds available, information sent, decision pending:  [facility]   At capacity:  [facility]   Quantay Zaremba, MA Triage Specialist [phone]     

## 2016-03-02 NOTE — BH Assessment (Signed)
[facility] 03[facility]02/16 Per Britta MccreedyBarbara, [facility] bed 2 by Dr. Tonita PhoenixKomissarova to  call report [phone]. Do not call report until law [facility] is [facility]. Do not have pt arrive between 11pm and 6 am.

## 2016-03-02 NOTE — ED Notes (Signed)
Patient has stayed in his room most of the day.  He has come out to use the bathroom only.  He is eating some, but not much and is taking in small amounts of fluids.  He is refusing all medications and has refused all phone calls and does not answer many questions.  He does deny thoughts of harm to self or others and he minimizes the events which brought him into the hospital.

## 2016-03-03 NOTE — ED Notes (Signed)
Pt was cooperative as he left with the sheriff [facility] [place].

## 2016-08-07 IMAGING — DX DG SHOULDER 2+V*R*
3 series · 3 of 3 positions shown · non-contrast
Comparison: None.

CLINICAL DATA: Injury while moving a couch. Right shoulder gave
out. Possible dislocation.

EXAM:
RIGHT SHOULDER - 2+ VIEW

[shoulder grashey]
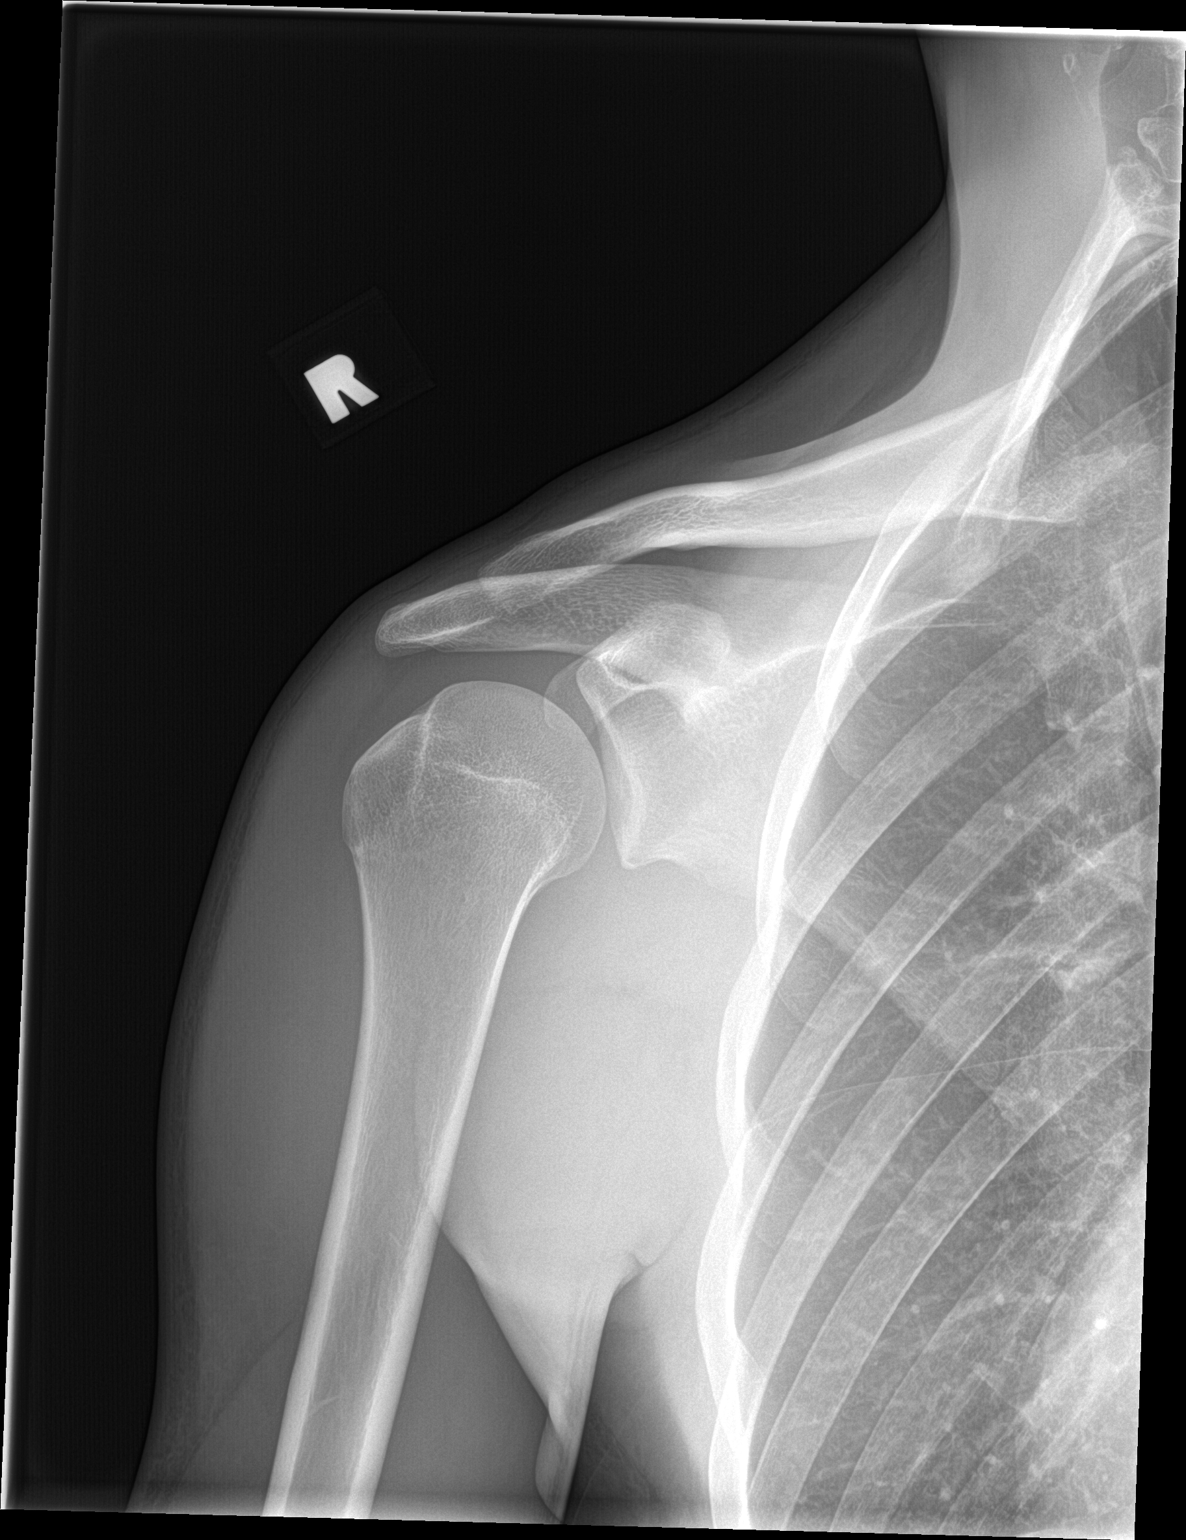

[shoulder y view]
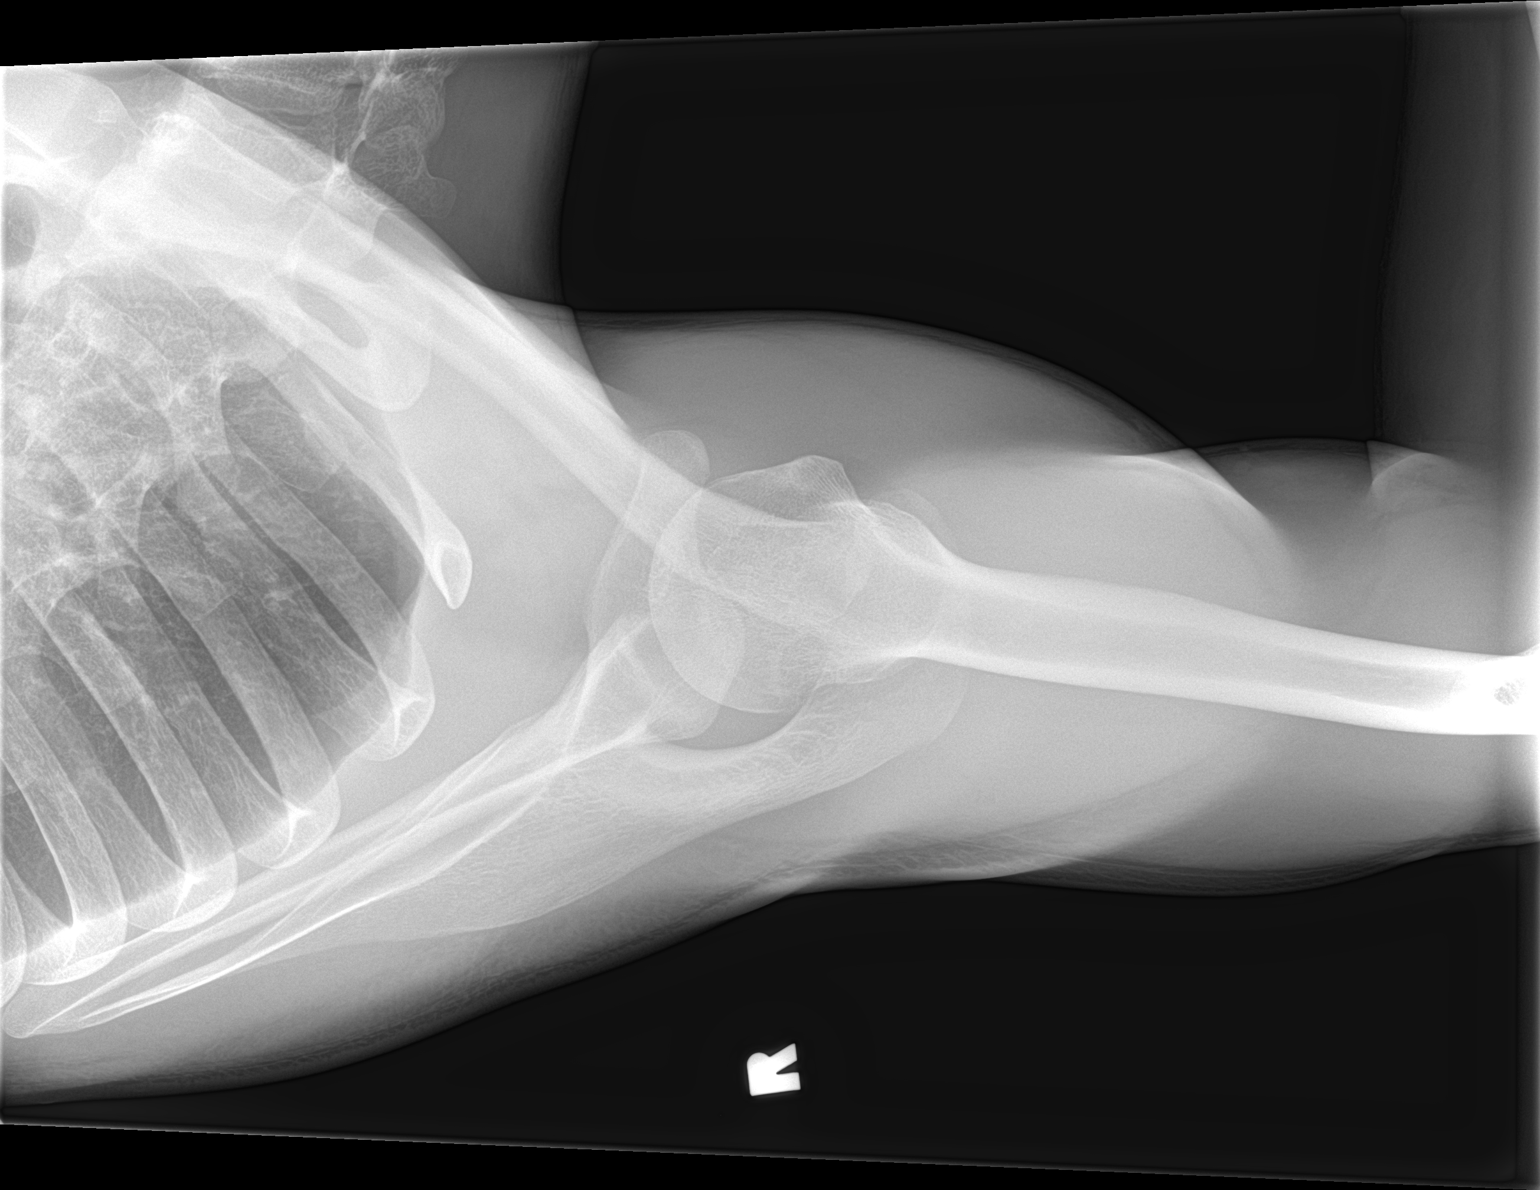

[shoulder axillary]
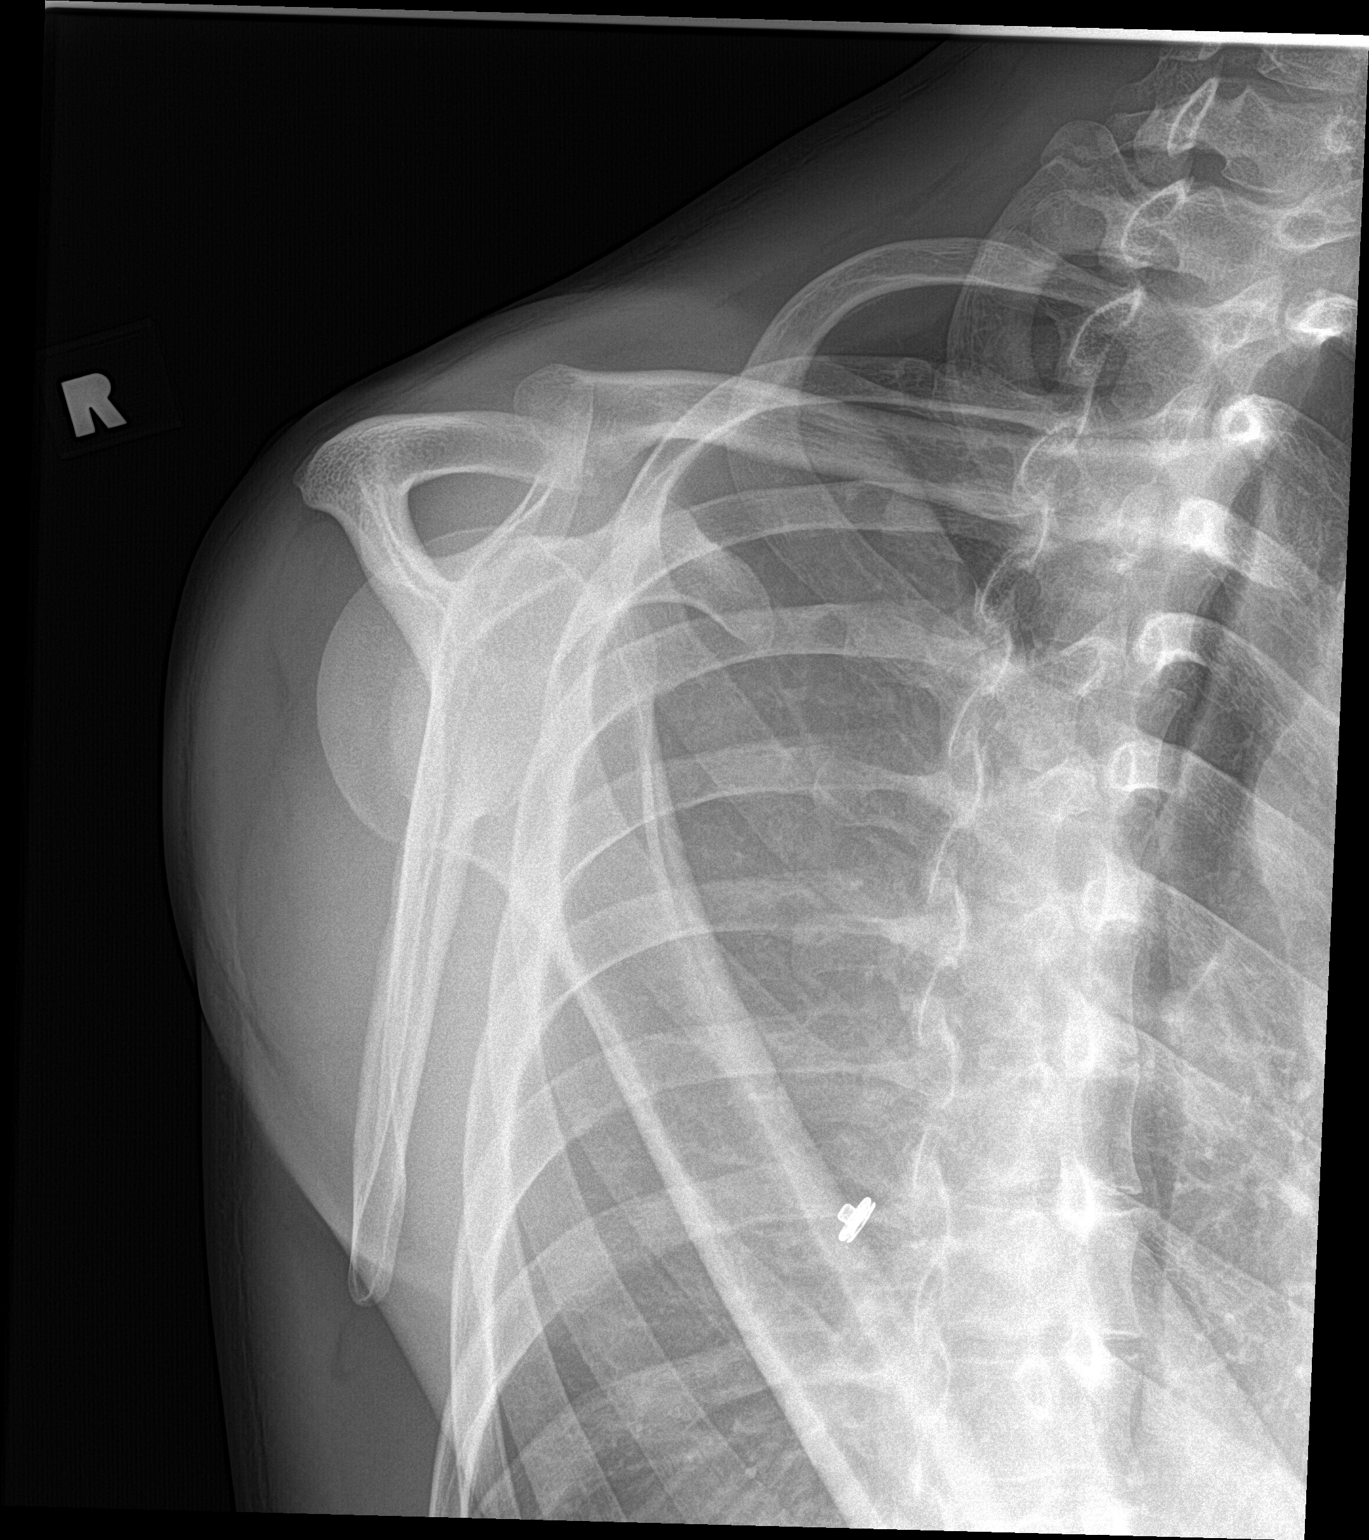

[3 of 3 positions shown; findings below may reference images not displayed]

FINDINGS: There is no evidence of fracture or dislocation. There is no
evidence of arthropathy or other focal bone abnormality. Soft
tissues are unremarkable.
IMPRESSION: Negative.

## 2016-08-21 ENCOUNTER — Inpatient Hospital Stay (HOSPITAL_COMMUNITY)
Admission: EM | Admit: 2016-08-21 | Discharge: 2016-08-26 | DRG: 958 | Disposition: A | Discharge status: Other Acute Inpatient Hospital | Payer: No Typology Code available for payment source | Attending: General Surgery | Admitting: General Surgery

## 2016-08-21 ENCOUNTER — Emergency Department (HOSPITAL_COMMUNITY): Payer: No Typology Code available for payment source

## 2016-08-21 DIAGNOSIS — Z419 Encounter for procedure for purposes other than remedying health state, unspecified: Secondary | ICD-10-CM

## 2016-08-21 DIAGNOSIS — S129XXA Fracture of neck, unspecified, initial encounter: Secondary | ICD-10-CM | POA: Diagnosis present

## 2016-08-21 DIAGNOSIS — Z5331 Laparoscopic surgical procedure converted to open procedure: Secondary | ICD-10-CM

## 2016-08-21 DIAGNOSIS — S12690A Other displaced fracture of seventh cervical vertebra, initial encounter for closed fracture: Secondary | ICD-10-CM | POA: Diagnosis present

## 2016-08-21 DIAGNOSIS — Z781 Physical restraint status: Secondary | ICD-10-CM

## 2016-08-21 DIAGNOSIS — Z9889 Other specified postprocedural states: Secondary | ICD-10-CM

## 2016-08-21 DIAGNOSIS — T1490XA Injury, unspecified, initial encounter: Secondary | ICD-10-CM

## 2016-08-21 DIAGNOSIS — T07XXXA Unspecified multiple injuries, initial encounter: Secondary | ICD-10-CM

## 2016-08-21 DIAGNOSIS — S36538A Laceration of other part of colon, initial encounter: Secondary | ICD-10-CM | POA: Diagnosis present

## 2016-08-21 DIAGNOSIS — S82291C Other fracture of shaft of right tibia, initial encounter for open fracture type IIIA, IIIB, or IIIC: Principal | ICD-10-CM | POA: Diagnosis present

## 2016-08-21 DIAGNOSIS — R339 Retention of urine, unspecified: Secondary | ICD-10-CM | POA: Diagnosis not present

## 2016-08-21 DIAGNOSIS — H9202 Otalgia, left ear: Secondary | ICD-10-CM | POA: Diagnosis not present

## 2016-08-21 DIAGNOSIS — K561 Intussusception: Secondary | ICD-10-CM | POA: Diagnosis present

## 2016-08-21 DIAGNOSIS — Z9119 Patient's noncompliance with other medical treatment and regimen: Secondary | ICD-10-CM

## 2016-08-21 DIAGNOSIS — Z0189 Encounter for other specified special examinations: Secondary | ICD-10-CM

## 2016-08-21 DIAGNOSIS — S82491C Other fracture of shaft of right fibula, initial encounter for open fracture type IIIA, IIIB, or IIIC: Secondary | ICD-10-CM | POA: Diagnosis present

## 2016-08-21 DIAGNOSIS — S12590A Other displaced fracture of sixth cervical vertebra, initial encounter for closed fracture: Secondary | ICD-10-CM | POA: Diagnosis present

## 2016-08-21 DIAGNOSIS — S82401B Unspecified fracture of shaft of right fibula, initial encounter for open fracture type I or II: Secondary | ICD-10-CM

## 2016-08-21 DIAGNOSIS — S36530A Laceration of ascending [right] colon, initial encounter: Secondary | ICD-10-CM | POA: Diagnosis present

## 2016-08-21 DIAGNOSIS — S36539A Laceration of unspecified part of colon, initial encounter: Secondary | ICD-10-CM | POA: Diagnosis present

## 2016-08-21 DIAGNOSIS — R402252 Coma scale, best verbal response, oriented, at arrival to emergency department: Secondary | ICD-10-CM | POA: Diagnosis present

## 2016-08-21 DIAGNOSIS — E872 Acidosis: Secondary | ICD-10-CM | POA: Diagnosis present

## 2016-08-21 DIAGNOSIS — R413 Other amnesia: Secondary | ICD-10-CM | POA: Diagnosis present

## 2016-08-21 DIAGNOSIS — S01312A Laceration without foreign body of left ear, initial encounter: Secondary | ICD-10-CM | POA: Diagnosis present

## 2016-08-21 DIAGNOSIS — R338 Other retention of urine: Secondary | ICD-10-CM | POA: Diagnosis not present

## 2016-08-21 DIAGNOSIS — S82201B Unspecified fracture of shaft of right tibia, initial encounter for open fracture type I or II: Secondary | ICD-10-CM | POA: Diagnosis present

## 2016-08-21 DIAGNOSIS — R402142 Coma scale, eyes open, spontaneous, at arrival to emergency department: Secondary | ICD-10-CM | POA: Diagnosis present

## 2016-08-21 DIAGNOSIS — S92255A Nondisplaced fracture of navicular [scaphoid] of left foot, initial encounter for closed fracture: Secondary | ICD-10-CM | POA: Diagnosis present

## 2016-08-21 DIAGNOSIS — Z8781 Personal history of (healed) traumatic fracture: Secondary | ICD-10-CM

## 2016-08-21 DIAGNOSIS — T148XXA Other injury of unspecified body region, initial encounter: Secondary | ICD-10-CM

## 2016-08-21 DIAGNOSIS — S92202A Fracture of unspecified tarsal bone(s) of left foot, initial encounter for closed fracture: Secondary | ICD-10-CM | POA: Diagnosis present

## 2016-08-21 DIAGNOSIS — F17210 Nicotine dependence, cigarettes, uncomplicated: Secondary | ICD-10-CM | POA: Diagnosis present

## 2016-08-21 DIAGNOSIS — S01412A Laceration without foreign body of left cheek and temporomandibular area, initial encounter: Secondary | ICD-10-CM | POA: Diagnosis present

## 2016-08-21 DIAGNOSIS — D62 Acute posthemorrhagic anemia: Secondary | ICD-10-CM | POA: Diagnosis not present

## 2016-08-21 DIAGNOSIS — S36892A Contusion of other intra-abdominal organs, initial encounter: Secondary | ICD-10-CM | POA: Diagnosis present

## 2016-08-21 DIAGNOSIS — S01302A Unspecified open wound of left ear, initial encounter: Secondary | ICD-10-CM | POA: Diagnosis present

## 2016-08-21 DIAGNOSIS — R402362 Coma scale, best motor response, obeys commands, at arrival to emergency department: Secondary | ICD-10-CM | POA: Diagnosis present

## 2016-08-21 LAB — CBC
HCT: 45.3 % (ref 39.0–52.0)
HEMOGLOBIN: 14.9 g/dL (ref 13.0–17.0)
MCH: 28.9 pg (ref 26.0–34.0)
MCHC: 32.9 g/dL (ref 30.0–36.0)
MCV: 88 fL (ref 78.0–100.0)
PLATELETS: 284 10*3/uL (ref 150–400)
RBC: 5.15 MIL/uL (ref 4.22–5.81)
RDW: 12.4 % (ref 11.5–15.5)
WBC: 21.1 10*3/uL — ABNORMAL HIGH (ref 4.0–10.5)

## 2016-08-21 LAB — I-STAT CG4 LACTIC ACID, ED
LACTIC ACID, VENOUS: 4.16 mmol/L — AB (ref 0.5–1.9)
Lactic Acid, Venous: 3.99 mmol/L (ref 0.5–1.9)

## 2016-08-21 LAB — COMPREHENSIVE METABOLIC PANEL
ALBUMIN: 3.7 g/dL (ref 3.5–5.0)
ALK PHOS: 54 U/L (ref 38–126)
ALT: 63 U/L (ref 17–63)
AST: 92 U/L — AB (ref 15–41)
Anion gap: 10 (ref 5–15)
BUN: 10 mg/dL (ref 6–20)
CALCIUM: 8.6 mg/dL — AB (ref 8.9–10.3)
CO2: 22 mmol/L (ref 22–32)
CREATININE: 1.59 mg/dL — AB (ref 0.61–1.24)
Chloride: 107 mmol/L (ref 101–111)
GFR calc Af Amer: >60 mL/min (ref >60)
GFR calc non Af Amer: >60 mL/min (ref >60)
GLUCOSE: 136 mg/dL — AB (ref 65–99)
Potassium: 3.5 mmol/L (ref 3.5–5.1)
SODIUM: 139 mmol/L (ref 135–145)
Total Bilirubin: 0.7 mg/dL (ref 0.3–1.2)
Total Protein: 6.7 g/dL (ref 6.5–8.1)

## 2016-08-21 LAB — I-STAT CHEM 8, ED
BUN: 11 mg/dL (ref 6–20)
CHLORIDE: 103 mmol/L (ref 101–111)
Calcium, Ion: 1.08 mmol/L — ABNORMAL LOW (ref 1.15–1.40)
Creatinine, Ser: 1.4 mg/dL — ABNORMAL HIGH (ref 0.61–1.24)
Glucose, Bld: 134 mg/dL — ABNORMAL HIGH (ref 65–99)
HCT: 47 % (ref 39.0–52.0)
Hemoglobin: 16 g/dL (ref 13.0–17.0)
POTASSIUM: 3.4 mmol/L — AB (ref 3.5–5.1)
SODIUM: 141 mmol/L (ref 135–145)
TCO2: 25 mmol/L (ref 0–100)

## 2016-08-21 LAB — ETHANOL

## 2016-08-21 LAB — PROTIME-INR
INR: 1.13
Prothrombin Time: 14.6 seconds (ref 11.4–15.2)

## 2016-08-21 MED ORDER — CEFAZOLIN SODIUM-DEXTROSE 2-4 GM/100ML-% IV SOLN: 2.0000 g | Freq: Once | INTRAVENOUS | Status: DC

## 2016-08-21 MED ORDER — CEFAZOLIN SODIUM-DEXTROSE 2-4 GM/100ML-% IV SOLN
INTRAVENOUS | Status: AC
  Filled 2016-08-21: qty 100

## 2016-08-21 MED ORDER — DEXTROSE 5 % IV SOLN
INTRAVENOUS | Status: AC | PRN
  Administered 2016-08-21: 2 mg via INTRAVENOUS

## 2016-08-21 MED ORDER — FENTANYL CITRATE (PF) 100 MCG/2ML IJ SOLN
INTRAMUSCULAR | Status: AC | PRN
  Administered 2016-08-21: 100 ug via INTRAVENOUS

## 2016-08-21 MED ORDER — SODIUM CHLORIDE 0.9 % IV SOLN
INTRAVENOUS | Status: AC | PRN
  Administered 2016-08-21: 1000 mL via INTRAVENOUS

## 2016-08-21 MED ORDER — TETANUS-DIPHTH-ACELL PERTUSSIS 5-2.5-18.5 LF-MCG/0.5 IM SUSP
0.5000 mL | Freq: Once | INTRAMUSCULAR | Status: AC
  Administered 2016-08-21: 0.5 mL via INTRAMUSCULAR
  Filled 2016-08-21: qty 0.5

## 2016-08-21 MED ORDER — HYDROMORPHONE HCL 1 MG/ML IJ SOLN
INTRAMUSCULAR | Status: AC
  Filled 2016-08-21: qty 1

## 2016-08-21 MED ORDER — HYDROMORPHONE HCL 1 MG/ML IJ SOLN
INTRAMUSCULAR | Status: AC | PRN
  Administered 2016-08-21 (×3): 0.5 mg via INTRAVENOUS

## 2016-08-21 MED ORDER — IOPAMIDOL (ISOVUE-300) INJECTION 61%
INTRAVENOUS | Status: AC
  Administered 2016-08-21: 100 mL
  Filled 2016-08-21: qty 100

## 2016-08-21 MED ORDER — HYDROMORPHONE HCL 1 MG/ML IJ SOLN
INTRAMUSCULAR | Status: AC | PRN
  Administered 2016-08-21: 0.5 mg via INTRAVENOUS

## 2016-08-21 NOTE — Progress Notes (Signed)
Orthopedic Tech Progress Note Patient Details:  David [name] 05/13/1996 [id number]  Ortho Devices Type of Ortho Device: Ace wrap, Stirrup splint, Post (short leg) splint Splint Material: Fiberglass Ortho Device/Splint Location: RLE Ortho Device/Splint Interventions: Ordered, Application   Jennye MoccasinHughes, Mahdiya Mossberg Craig 08/21/2016, 9:00 PM
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
## 2016-08-21 NOTE — ED Notes (Signed)
Chin Abrasion

## 2016-08-21 NOTE — ED Notes (Signed)
Left ear avulsion

## 2016-08-21 NOTE — ED Notes (Signed)
Right heel laceration, swelling left heel, abrasion bilateral knees,

## 2016-08-22 ENCOUNTER — Emergency Department (HOSPITAL_COMMUNITY): Payer: No Typology Code available for payment source

## 2016-08-22 ENCOUNTER — Encounter (HOSPITAL_COMMUNITY): Payer: Self-pay | Admitting: Emergency Medicine

## 2016-08-22 ENCOUNTER — Inpatient Hospital Stay (HOSPITAL_COMMUNITY): Payer: No Typology Code available for payment source

## 2016-08-22 ENCOUNTER — Encounter (HOSPITAL_COMMUNITY)
Admission: EM | Disposition: A | Discharge status: Other Acute Inpatient Hospital | Payer: Self-pay | Source: Home / Self Care

## 2016-08-22 ENCOUNTER — Emergency Department (HOSPITAL_COMMUNITY): Payer: No Typology Code available for payment source | Admitting: Certified Registered"

## 2016-08-22 DIAGNOSIS — S12690A Other displaced fracture of seventh cervical vertebra, initial encounter for closed fracture: Secondary | ICD-10-CM | POA: Diagnosis not present

## 2016-08-22 DIAGNOSIS — S92255A Nondisplaced fracture of navicular [scaphoid] of left foot, initial encounter for closed fracture: Secondary | ICD-10-CM | POA: Diagnosis not present

## 2016-08-22 DIAGNOSIS — S01312A Laceration without foreign body of left ear, initial encounter: Secondary | ICD-10-CM | POA: Diagnosis not present

## 2016-08-22 DIAGNOSIS — R402252 Coma scale, best verbal response, oriented, at arrival to emergency department: Secondary | ICD-10-CM | POA: Diagnosis not present

## 2016-08-22 DIAGNOSIS — R402142 Coma scale, eyes open, spontaneous, at arrival to emergency department: Secondary | ICD-10-CM | POA: Diagnosis not present

## 2016-08-22 DIAGNOSIS — S36892A Contusion of other intra-abdominal organs, initial encounter: Secondary | ICD-10-CM | POA: Diagnosis not present

## 2016-08-22 DIAGNOSIS — Z9119 Patient's noncompliance with other medical treatment and regimen: Secondary | ICD-10-CM | POA: Diagnosis not present

## 2016-08-22 DIAGNOSIS — R402362 Coma scale, best motor response, obeys commands, at arrival to emergency department: Secondary | ICD-10-CM | POA: Diagnosis not present

## 2016-08-22 DIAGNOSIS — S82491C Other fracture of shaft of right fibula, initial encounter for open fracture type IIIA, IIIB, or IIIC: Secondary | ICD-10-CM | POA: Diagnosis not present

## 2016-08-22 DIAGNOSIS — S12590A Other displaced fracture of sixth cervical vertebra, initial encounter for closed fracture: Secondary | ICD-10-CM | POA: Diagnosis not present

## 2016-08-22 DIAGNOSIS — H9202 Otalgia, left ear: Secondary | ICD-10-CM | POA: Diagnosis present

## 2016-08-22 DIAGNOSIS — S82291C Other fracture of shaft of right tibia, initial encounter for open fracture type IIIA, IIIB, or IIIC: Secondary | ICD-10-CM | POA: Diagnosis present

## 2016-08-22 DIAGNOSIS — E872 Acidosis: Secondary | ICD-10-CM | POA: Diagnosis not present

## 2016-08-22 DIAGNOSIS — R413 Other amnesia: Secondary | ICD-10-CM | POA: Diagnosis not present

## 2016-08-22 DIAGNOSIS — D62 Acute posthemorrhagic anemia: Secondary | ICD-10-CM | POA: Diagnosis not present

## 2016-08-22 DIAGNOSIS — S01412A Laceration without foreign body of left cheek and temporomandibular area, initial encounter: Secondary | ICD-10-CM | POA: Diagnosis not present

## 2016-08-22 DIAGNOSIS — Z5331 Laparoscopic surgical procedure converted to open procedure: Secondary | ICD-10-CM | POA: Diagnosis not present

## 2016-08-22 DIAGNOSIS — S36530A Laceration of ascending [right] colon, initial encounter: Secondary | ICD-10-CM | POA: Diagnosis not present

## 2016-08-22 DIAGNOSIS — R339 Retention of urine, unspecified: Secondary | ICD-10-CM | POA: Diagnosis not present

## 2016-08-22 DIAGNOSIS — Z781 Physical restraint status: Secondary | ICD-10-CM | POA: Diagnosis not present

## 2016-08-22 DIAGNOSIS — F17210 Nicotine dependence, cigarettes, uncomplicated: Secondary | ICD-10-CM | POA: Diagnosis not present

## 2016-08-22 DIAGNOSIS — T1490XA Injury, unspecified, initial encounter: Secondary | ICD-10-CM

## 2016-08-22 DIAGNOSIS — K561 Intussusception: Secondary | ICD-10-CM | POA: Diagnosis not present

## 2016-08-22 DIAGNOSIS — S36538A Laceration of other part of colon, initial encounter: Secondary | ICD-10-CM | POA: Diagnosis not present

## 2016-08-22 HISTORY — PX: I & D EXTREMITY: SHX5045

## 2016-08-22 HISTORY — PX: EXTERNAL FIXATION LEG: SHX1549

## 2016-08-22 HISTORY — PX: LAPAROSCOPY: SHX197

## 2016-08-22 HISTORY — PX: LAPAROTOMY: SHX154

## 2016-08-22 HISTORY — PX: OTOPLASATY: SHX1485

## 2016-08-22 LAB — POCT I-STAT 4, (NA,K, GLUC, HGB,HCT)
Glucose, Bld: 120 mg/dL — ABNORMAL HIGH (ref 65–99)
HCT: 32 % — ABNORMAL LOW (ref 39.0–52.0)
HEMOGLOBIN: 10.9 g/dL — AB (ref 13.0–17.0)
POTASSIUM: 5 mmol/L (ref 3.5–5.1)
SODIUM: 141 mmol/L (ref 135–145)

## 2016-08-22 LAB — CBC
HEMATOCRIT: 41.6 % (ref 39.0–52.0)
HEMOGLOBIN: 13.3 g/dL (ref 13.0–17.0)
MCH: 28.4 pg (ref 26.0–34.0)
MCHC: 32 g/dL (ref 30.0–36.0)
MCV: 88.9 fL (ref 78.0–100.0)
Platelets: 225 10*3/uL (ref 150–400)
RBC: 4.68 MIL/uL (ref 4.22–5.81)
RDW: 12.5 % (ref 11.5–15.5)
WBC: 18.5 10*3/uL — ABNORMAL HIGH (ref 4.0–10.5)

## 2016-08-22 LAB — RAPID URINE DRUG SCREEN, HOSP PERFORMED
Amphetamines: NOT DETECTED
Barbiturates: NOT DETECTED
Benzodiazepines: POSITIVE — AB
COCAINE: POSITIVE — AB
OPIATES: POSITIVE — AB
TETRAHYDROCANNABINOL: POSITIVE — AB

## 2016-08-22 LAB — BASIC METABOLIC PANEL
ANION GAP: 8 (ref 5–15)
BUN: 10 mg/dL (ref 6–20)
CALCIUM: 8 mg/dL — AB (ref 8.9–10.3)
CO2: 23 mmol/L (ref 22–32)
Chloride: 108 mmol/L (ref 101–111)
Creatinine, Ser: 1.24 mg/dL (ref 0.61–1.24)
GFR calc Af Amer: >60 mL/min (ref >60)
GLUCOSE: 143 mg/dL — AB (ref 65–99)
Potassium: 6.4 mmol/L (ref 3.5–5.1)
Sodium: 139 mmol/L (ref 135–145)

## 2016-08-22 LAB — MRSA PCR SCREENING: MRSA by PCR: NEGATIVE

## 2016-08-22 LAB — POTASSIUM: Potassium: 5.3 mmol/L — ABNORMAL HIGH (ref 3.5–5.1)

## 2016-08-22 LAB — CDS SEROLOGY

## 2016-08-22 SURGERY — IRRIGATION AND DEBRIDEMENT EXTREMITY
Anesthesia: General | Site: Ear | Laterality: Right

## 2016-08-22 MED ORDER — ENOXAPARIN SODIUM 40 MG/0.4ML ~~LOC~~ SOLN
40.0000 mg | SUBCUTANEOUS | Status: DC
  Administered 2016-08-22 – 2016-08-23 (×2): 40 mg via SUBCUTANEOUS
  Filled 2016-08-22 (×3): qty 0.4

## 2016-08-22 MED ORDER — ROCURONIUM BROMIDE 10 MG/ML (PF) SYRINGE
PREFILLED_SYRINGE | INTRAVENOUS | Status: AC
  Filled 2016-08-22: qty 40

## 2016-08-22 MED ORDER — DIPHENHYDRAMINE HCL 50 MG/ML IJ SOLN
12.5000 mg | Freq: Four times a day (QID) | INTRAMUSCULAR | Status: DC | PRN
  Administered 2016-08-23: 12.5 mg via INTRAVENOUS
  Filled 2016-08-22: qty 1
  Filled 2016-08-22: qty 0.25

## 2016-08-22 MED ORDER — SODIUM CHLORIDE 0.9 % IR SOLN
Status: DC | PRN
  Administered 2016-08-22 (×2): 1000 mL
  Administered 2016-08-22: 3000 mL
  Administered 2016-08-22: 1000 mL
  Administered 2016-08-22: 3000 mL

## 2016-08-22 MED ORDER — CEFAZOLIN SODIUM-DEXTROSE 2-4 GM/100ML-% IV SOLN
2.0000 g | Freq: Once | INTRAVENOUS | Status: DC
  Filled 2016-08-22: qty 100

## 2016-08-22 MED ORDER — SUCCINYLCHOLINE CHLORIDE 200 MG/10ML IV SOSY
PREFILLED_SYRINGE | INTRAVENOUS | Status: AC
  Filled 2016-08-22: qty 10

## 2016-08-22 MED ORDER — MORPHINE SULFATE 2 MG/ML IV SOLN
INTRAVENOUS | Status: DC
  Administered 2016-08-22: 06:00:00 via INTRAVENOUS
  Administered 2016-08-22: 19 mg via INTRAVENOUS
  Administered 2016-08-22: 4.5 mg via INTRAVENOUS
  Administered 2016-08-22: 3 mg via INTRAVENOUS
  Administered 2016-08-23: 13:00:00 via INTRAVENOUS
  Administered 2016-08-23: 19.5 mg via INTRAVENOUS
  Administered 2016-08-23: 12 mg via INTRAVENOUS
  Administered 2016-08-23: 15 mg via INTRAVENOUS
  Administered 2016-08-24 (×2): 12 mg via INTRAVENOUS
  Administered 2016-08-24: 7.5 mg via INTRAVENOUS
  Administered 2016-08-24: 4.5 mg via INTRAVENOUS
  Administered 2016-08-24: 1.5 mg via INTRAVENOUS
  Administered 2016-08-24: 6 mg via INTRAVENOUS
  Administered 2016-08-25: 2.78 mg via INTRAVENOUS
  Administered 2016-08-25: 15 mg via INTRAVENOUS
  Administered 2016-08-25: 0 mg via INTRAVENOUS
  Filled 2016-08-22 (×4): qty 25

## 2016-08-22 MED ORDER — MEPERIDINE HCL 25 MG/ML IJ SOLN: 6.2500 mg | INTRAMUSCULAR | Status: DC | PRN

## 2016-08-22 MED ORDER — CEFAZOLIN SODIUM-DEXTROSE 2-4 GM/100ML-% IV SOLN
2.0000 g | Freq: Three times a day (TID) | INTRAVENOUS | Status: AC
  Administered 2016-08-22 – 2016-08-25 (×9): 2 g via INTRAVENOUS
  Filled 2016-08-22 (×12): qty 100

## 2016-08-22 MED ORDER — BUPIVACAINE-EPINEPHRINE (PF) 0.25% -1:200000 IJ SOLN
INTRAMUSCULAR | Status: AC
  Filled 2016-08-22: qty 30

## 2016-08-22 MED ORDER — FENTANYL CITRATE (PF) 100 MCG/2ML IJ SOLN
INTRAMUSCULAR | Status: AC
  Filled 2016-08-22: qty 2

## 2016-08-22 MED ORDER — SUGAMMADEX SODIUM 500 MG/5ML IV SOLN
INTRAVENOUS | Status: AC
  Filled 2016-08-22: qty 5

## 2016-08-22 MED ORDER — HYDROMORPHONE HCL 1 MG/ML IJ SOLN
INTRAMUSCULAR | Status: DC | PRN
  Administered 2016-08-22: 1 mg via INTRAVENOUS

## 2016-08-22 MED ORDER — BACITRACIN-NEOMYCIN-POLYMYXIN 400-5-5000 EX OINT
TOPICAL_OINTMENT | CUTANEOUS | Status: AC
  Filled 2016-08-22: qty 1

## 2016-08-22 MED ORDER — CEFAZOLIN SODIUM 1 G IJ SOLR
INTRAMUSCULAR | Status: AC
Start: 2016-08-22 — End: 2016-08-22
  Filled 2016-08-22: qty 20

## 2016-08-22 MED ORDER — HYDROMORPHONE HCL 1 MG/ML IJ SOLN
INTRAMUSCULAR | Status: AC
  Filled 2016-08-22: qty 1

## 2016-08-22 MED ORDER — NALOXONE HCL 0.4 MG/ML IJ SOLN
0.4000 mg | INTRAMUSCULAR | Status: DC | PRN
  Filled 2016-08-22: qty 1

## 2016-08-22 MED ORDER — CEFAZOLIN SODIUM-DEXTROSE 2-3 GM-% IV SOLR
INTRAVENOUS | Status: DC | PRN
  Administered 2016-08-22: 2 g via INTRAVENOUS

## 2016-08-22 MED ORDER — HYDROMORPHONE HCL 1 MG/ML IJ SOLN
0.2500 mg | INTRAMUSCULAR | Status: DC | PRN
  Administered 2016-08-22 (×2): 0.5 mg via INTRAVENOUS

## 2016-08-22 MED ORDER — PROPOFOL 10 MG/ML IV BOLUS
INTRAVENOUS | Status: AC
  Filled 2016-08-22: qty 20

## 2016-08-22 MED ORDER — MIDAZOLAM HCL 5 MG/5ML IJ SOLN
INTRAMUSCULAR | Status: DC | PRN
  Administered 2016-08-22: 2 mg via INTRAVENOUS

## 2016-08-22 MED ORDER — PHENYLEPHRINE 40 MCG/ML (10ML) SYRINGE FOR IV PUSH (FOR BLOOD PRESSURE SUPPORT)
PREFILLED_SYRINGE | INTRAVENOUS | Status: AC
  Filled 2016-08-22: qty 20

## 2016-08-22 MED ORDER — KCL IN DEXTROSE-NACL 20-5-0.45 MEQ/L-%-% IV SOLN
INTRAVENOUS | Status: AC
  Filled 2016-08-22: qty 1000

## 2016-08-22 MED ORDER — LIDOCAINE 2% (20 MG/ML) 5 ML SYRINGE
INTRAMUSCULAR | Status: AC
  Filled 2016-08-22: qty 10

## 2016-08-22 MED ORDER — ONDANSETRON HCL 4 MG/2ML IJ SOLN
INTRAMUSCULAR | Status: AC
  Filled 2016-08-22: qty 2

## 2016-08-22 MED ORDER — ONDANSETRON HCL 4 MG/2ML IJ SOLN
INTRAMUSCULAR | Status: DC | PRN
  Administered 2016-08-22: 4 mg via INTRAVENOUS

## 2016-08-22 MED ORDER — SODIUM CHLORIDE 0.9 % IV BOLUS (SEPSIS): 1000.0000 mL | Freq: Once | INTRAVENOUS | Status: DC

## 2016-08-22 MED ORDER — ONDANSETRON HCL 4 MG/2ML IJ SOLN
4.0000 mg | Freq: Four times a day (QID) | INTRAMUSCULAR | Status: DC | PRN
  Administered 2016-08-22 – 2016-08-23 (×2): 4 mg via INTRAVENOUS
  Filled 2016-08-22 (×2): qty 2

## 2016-08-22 MED ORDER — FENTANYL CITRATE (PF) 100 MCG/2ML IJ SOLN
INTRAMUSCULAR | Status: DC | PRN
Start: 2016-08-22 — End: 2016-08-22
  Administered 2016-08-22 (×2): 50 ug via INTRAVENOUS
  Administered 2016-08-22 (×4): 100 ug via INTRAVENOUS

## 2016-08-22 MED ORDER — SUCCINYLCHOLINE CHLORIDE 20 MG/ML IJ SOLN
INTRAMUSCULAR | Status: DC | PRN
  Administered 2016-08-22: 120 mg via INTRAVENOUS

## 2016-08-22 MED ORDER — PROMETHAZINE HCL 25 MG/ML IJ SOLN
INTRAMUSCULAR | Status: AC
  Filled 2016-08-22: qty 1

## 2016-08-22 MED ORDER — LACTATED RINGERS IV SOLN
INTRAVENOUS | Status: DC | PRN
  Administered 2016-08-22 (×4): via INTRAVENOUS

## 2016-08-22 MED ORDER — SODIUM CHLORIDE 0.9% FLUSH: 9.0000 mL | INTRAVENOUS | Status: DC | PRN

## 2016-08-22 MED ORDER — ORAL CARE MOUTH RINSE
15.0000 mL | Freq: Two times a day (BID) | OROMUCOSAL | Status: DC
  Administered 2016-08-22 – 2016-08-24 (×4): 15 mL via OROMUCOSAL

## 2016-08-22 MED ORDER — MIDAZOLAM HCL 2 MG/2ML IJ SOLN
INTRAMUSCULAR | Status: AC
Start: 2016-08-22 — End: 2016-08-22
  Filled 2016-08-22: qty 2

## 2016-08-22 MED ORDER — BUPIVACAINE-EPINEPHRINE 0.25% -1:200000 IJ SOLN
INTRAMUSCULAR | Status: DC | PRN
  Administered 2016-08-22: 6 mL

## 2016-08-22 MED ORDER — MORPHINE SULFATE 2 MG/ML IV SOLN
INTRAVENOUS | Status: AC
  Filled 2016-08-22: qty 25

## 2016-08-22 MED ORDER — ROCURONIUM BROMIDE 100 MG/10ML IV SOLN
INTRAVENOUS | Status: DC | PRN
  Administered 2016-08-22: 30 mg via INTRAVENOUS
  Administered 2016-08-22 (×2): 20 mg via INTRAVENOUS
  Administered 2016-08-22: 50 mg via INTRAVENOUS
  Administered 2016-08-22: 30 mg via INTRAVENOUS

## 2016-08-22 MED ORDER — LIDOCAINE HCL (CARDIAC) 20 MG/ML IV SOLN
INTRAVENOUS | Status: DC | PRN
  Administered 2016-08-22: 60 mg via INTRAVENOUS

## 2016-08-22 MED ORDER — KCL IN DEXTROSE-NACL 20-5-0.45 MEQ/L-%-% IV SOLN
INTRAVENOUS | Status: DC
  Administered 2016-08-22: 125 mL/h via INTRAVENOUS
  Administered 2016-08-22: 07:00:00 via INTRAVENOUS
  Administered 2016-08-23: 125 mL/h via INTRAVENOUS
  Administered 2016-08-24 – 2016-08-25 (×2): via INTRAVENOUS
  Filled 2016-08-22 (×6): qty 1000

## 2016-08-22 MED ORDER — PHENYLEPHRINE HCL 10 MG/ML IJ SOLN
INTRAMUSCULAR | Status: DC | PRN
Start: 2016-08-22 — End: 2016-08-22
  Administered 2016-08-22 (×2): 80 ug via INTRAVENOUS

## 2016-08-22 MED ORDER — PROMETHAZINE HCL 25 MG/ML IJ SOLN
6.2500 mg | INTRAMUSCULAR | Status: DC | PRN
  Administered 2016-08-22: 12.5 mg via INTRAVENOUS

## 2016-08-22 MED ORDER — CEFAZOLIN SODIUM-DEXTROSE 2-4 GM/100ML-% IV SOLN: 2.0000 g | INTRAVENOUS | Status: DC

## 2016-08-22 MED ORDER — PROPOFOL 10 MG/ML IV BOLUS
INTRAVENOUS | Status: DC | PRN
  Administered 2016-08-22: 130 mg via INTRAVENOUS

## 2016-08-22 MED ORDER — SUGAMMADEX SODIUM 500 MG/5ML IV SOLN
INTRAVENOUS | Status: DC | PRN
  Administered 2016-08-22: 300 mg via INTRAVENOUS

## 2016-08-22 MED ORDER — FAMOTIDINE IN NACL 20-0.9 MG/50ML-% IV SOLN
20.0000 mg | Freq: Two times a day (BID) | INTRAVENOUS | Status: DC
  Administered 2016-08-22 – 2016-08-24 (×5): 20 mg via INTRAVENOUS
  Filled 2016-08-22 (×7): qty 50

## 2016-08-22 MED ORDER — DIPHENHYDRAMINE HCL 12.5 MG/5ML PO ELIX
12.5000 mg | ORAL_SOLUTION | Freq: Four times a day (QID) | ORAL | Status: DC | PRN
  Filled 2016-08-22: qty 5

## 2016-08-22 MED ORDER — LACTATED RINGERS IV SOLN: INTRAVENOUS | Status: DC

## 2016-08-22 MED ORDER — PANTOPRAZOLE SODIUM 40 MG PO TBEC
40.0000 mg | DELAYED_RELEASE_TABLET | Freq: Two times a day (BID) | ORAL | Status: DC
  Administered 2016-08-25 (×2): 40 mg via ORAL
  Filled 2016-08-22 (×7): qty 1

## 2016-08-22 MED ORDER — BACITRACIN ZINC 500 UNIT/GM EX OINT
TOPICAL_OINTMENT | CUTANEOUS | Status: DC | PRN
  Administered 2016-08-22: 1 via TOPICAL

## 2016-08-22 SURGICAL SUPPLY — 130 items
BANDAGE ACE 4X5 VEL STRL LF (GAUZE/BANDAGES/DRESSINGS) ×4 IMPLANT
BANDAGE ACE 6X5 VEL STRL LF (GAUZE/BANDAGES/DRESSINGS) ×4 IMPLANT
BANDAGE ELASTIC 3 VELCRO ST LF (GAUZE/BANDAGES/DRESSINGS) IMPLANT
BANDAGE ELASTIC 4 VELCRO ST LF (GAUZE/BANDAGES/DRESSINGS) ×2 IMPLANT
BANDAGE ESMARK 6X9 LF (GAUZE/BANDAGES/DRESSINGS) ×4 IMPLANT
BLADE SURG 10 STRL SS (BLADE) ×4 IMPLANT
BLADE SURG ROTATE 9660 (MISCELLANEOUS) ×2 IMPLANT
BNDG CMPR 9X6 STRL LF SNTH (GAUZE/BANDAGES/DRESSINGS)
BNDG COHESIVE 4X5 TAN STRL (GAUZE/BANDAGES/DRESSINGS) ×4 IMPLANT
BNDG COHESIVE 6X5 TAN STRL LF (GAUZE/BANDAGES/DRESSINGS) ×10 IMPLANT
BNDG CONFORM 3 STRL LF (GAUZE/BANDAGES/DRESSINGS) IMPLANT
BNDG ESMARK 6X9 LF (GAUZE/BANDAGES/DRESSINGS)
BNDG GAUZE ELAST 4 BULKY (GAUZE/BANDAGES/DRESSINGS) ×6 IMPLANT
BNDG GAUZE STRTCH 6 (GAUZE/BANDAGES/DRESSINGS) ×12 IMPLANT
CANISTER WOUND CARE 500ML ATS (WOUND CARE) ×2 IMPLANT
CHLORAPREP W/TINT 26ML (MISCELLANEOUS) ×2 IMPLANT
CORDS BIPOLAR (ELECTRODE) IMPLANT
COUNTER NEEDLE 20 DBL MAG RED (NEEDLE) ×2 IMPLANT
COVER MAYO STAND STRL (DRAPES) ×8 IMPLANT
COVER SURGICAL LIGHT HANDLE (MISCELLANEOUS) ×8 IMPLANT
CUFF TOURNIQUET SINGLE 24IN (TOURNIQUET CUFF) IMPLANT
CUFF TOURNIQUET SINGLE 34IN LL (TOURNIQUET CUFF) ×8 IMPLANT
CUFF TOURNIQUET SINGLE 44IN (TOURNIQUET CUFF) IMPLANT
DRAPE C-ARM 42X72 X-RAY (DRAPES) ×6 IMPLANT
DRAPE C-ARMOR (DRAPES) ×6 IMPLANT
DRAPE EXTREMITY BILATERAL (DRAPES) IMPLANT
DRAPE EXTREMITY T 121X128X90 (DRAPE) ×4 IMPLANT
DRAPE IMP U-DRAPE 54X76 (DRAPES) ×4 IMPLANT
DRAPE INCISE IOBAN 66X45 STRL (DRAPES) ×18 IMPLANT
DRAPE POUCH INSTRU U-SHP 10X18 (DRAPES) ×4 IMPLANT
DRAPE SURG 17X23 STRL (DRAPES) IMPLANT
DRAPE U-SHAPE 47X51 STRL (DRAPES) ×6 IMPLANT
DRAPE UTILITY XL STRL (DRAPES) ×8 IMPLANT
DRSG EMULSION OIL 3X3 NADH (GAUZE/BANDAGES/DRESSINGS) ×2 IMPLANT
DRSG PAD ABDOMINAL 8X10 ST (GAUZE/BANDAGES/DRESSINGS) ×4 IMPLANT
DRSG TEGADERM 2-3/8X2-3/4 SM (GAUZE/BANDAGES/DRESSINGS) ×4 IMPLANT
DRSG VAC ATS SM SENSATRAC (GAUZE/BANDAGES/DRESSINGS) ×2 IMPLANT
ELECT CAUTERY BLADE 6.4 (BLADE) ×8 IMPLANT
ELECT REM PT RETURN 9FT ADLT (ELECTROSURGICAL) ×6
ELECTRODE REM PT RTRN 9FT ADLT (ELECTROSURGICAL) ×4 IMPLANT
GAUZE SPONGE 2X2 8PLY STRL LF (GAUZE/BANDAGES/DRESSINGS) IMPLANT
GAUZE SPONGE 4X4 12PLY STRL (GAUZE/BANDAGES/DRESSINGS) ×10 IMPLANT
GAUZE SPONGE 4X4 16PLY XRAY LF (GAUZE/BANDAGES/DRESSINGS) ×4 IMPLANT
GAUZE XEROFORM 1X8 LF (GAUZE/BANDAGES/DRESSINGS) ×10 IMPLANT
GAUZE XEROFORM 5X9 LF (GAUZE/BANDAGES/DRESSINGS) ×4 IMPLANT
GLOVE BIO SURGEON STRL SZ 6 (GLOVE) ×2 IMPLANT
GLOVE BIOGEL M STRL SZ7.5 (GLOVE) ×2 IMPLANT
GLOVE BIOGEL PI IND STRL 6.5 (GLOVE) IMPLANT
GLOVE BIOGEL PI IND STRL 7.0 (GLOVE) IMPLANT
GLOVE BIOGEL PI INDICATOR 6.5 (GLOVE) ×2
GLOVE BIOGEL PI INDICATOR 7.0 (GLOVE) ×2
GLOVE ECLIPSE 6.5 STRL STRAW (GLOVE) ×2 IMPLANT
GLOVE SKINSENSE NS SZ7.5 (GLOVE) ×6
GLOVE SKINSENSE STRL SZ7.5 (GLOVE) ×16 IMPLANT
GLOVE SURG SS PI 6.5 STRL IVOR (GLOVE) ×4 IMPLANT
GLOVE SURG SYN 7.5  E (GLOVE) ×4
GLOVE SURG SYN 7.5 E (GLOVE) ×8 IMPLANT
GLOVE SURG SYN 7.5 PF PI (GLOVE) IMPLANT
GOWN PREVENTION PLUS XLARGE (GOWN DISPOSABLE) ×2 IMPLANT
GOWN STRL REIN XL XLG (GOWN DISPOSABLE) ×10 IMPLANT
GOWN STRL REUS W/ TWL LRG LVL3 (GOWN DISPOSABLE) IMPLANT
GOWN STRL REUS W/TWL LRG LVL3 (GOWN DISPOSABLE) ×6
HANDPIECE INTERPULSE COAX TIP (DISPOSABLE)
KIT BASIN OR (CUSTOM PROCEDURE TRAY) ×8 IMPLANT
KIT ROOM TURNOVER OR (KITS) ×6 IMPLANT
MANIFOLD NEPTUNE II (INSTRUMENTS) ×6 IMPLANT
NDL HYPO 25GX1X1/2 BEV (NEEDLE) IMPLANT
NEEDLE HYPO 25GX1X1/2 BEV (NEEDLE) ×6 IMPLANT
NS IRRIG 1000ML POUR BTL (IV SOLUTION) ×10 IMPLANT
PACK ORTHO EXTREMITY (CUSTOM PROCEDURE TRAY) ×6 IMPLANT
PACK TOTAL JOINT (CUSTOM PROCEDURE TRAY) ×4 IMPLANT
PACK UNIVERSAL I (CUSTOM PROCEDURE TRAY) ×4 IMPLANT
PAD ABD 8X10 STRL (GAUZE/BANDAGES/DRESSINGS) ×4 IMPLANT
PAD ARMBOARD 7.5X6 YLW CONV (MISCELLANEOUS) ×12 IMPLANT
PAD CAST 4YDX4 CTTN HI CHSV (CAST SUPPLIES) ×8 IMPLANT
PADDING CAST ABS 4INX4YD NS (CAST SUPPLIES)
PADDING CAST ABS COTTON 4X4 ST (CAST SUPPLIES) ×8 IMPLANT
PADDING CAST COTTON 4X4 STRL (CAST SUPPLIES)
PADDING CAST COTTON 6X4 STRL (CAST SUPPLIES) ×4 IMPLANT
PIN APEX 5.0X180MM (PIN) ×4 IMPLANT
PIN TO ROD COUPLING EXFIX (EXFIX) ×4 IMPLANT
PIN TO ROD COUPLING INVERT (EXFIX) ×8 IMPLANT
PIN TRANSFIXING EXFIX (EXFIX) ×2 IMPLANT
ROD CARBON (EXFIX) ×12
ROD CNCT 400X11XNS LF HFMN (EXFIX) IMPLANT
ROD HOFFMANN3 CONNECT 11X200 (EXFIX) ×4 IMPLANT
ROD SEMI CIRCULAR EXFIX (EXFIX) ×2 IMPLANT
ROD TO ROD COUPLING EXFIX (EXFIX) ×8 IMPLANT
SCISSORS LAP 5X35 DISP (ENDOMECHANICALS) ×2 IMPLANT
SET HNDPC FAN SPRY TIP SCT (DISPOSABLE) IMPLANT
SET IRRIG TUBING LAPAROSCOPIC (IRRIGATION / IRRIGATOR) IMPLANT
SET IRRIG Y TYPE TUR BLADDER L (SET/KITS/TRAYS/PACK) ×2 IMPLANT
SLEEVE ENDOPATH XCEL 5M (ENDOMECHANICALS) ×2 IMPLANT
SOL PREP POV-IOD 4OZ 10% (MISCELLANEOUS) ×2 IMPLANT
SPONGE GAUZE 2X2 STER 10/PKG (GAUZE/BANDAGES/DRESSINGS) ×2
SPONGE LAP 18X18 X RAY DECT (DISPOSABLE) ×8 IMPLANT
STAPLER SKIN 35 WIDE (STAPLE) ×2 IMPLANT
STAPLER SKIN PROX WIDE 3.9 (STAPLE) ×4 IMPLANT
STOCKINETTE IMPERVIOUS 9X36 MD (GAUZE/BANDAGES/DRESSINGS) ×4 IMPLANT
SUT CHROMIC 3 0 PS 2 (SUTURE) ×2 IMPLANT
SUT CHROMIC 5 0 P 3 (SUTURE) ×4 IMPLANT
SUT ETHILON 2 0 FS 18 (SUTURE) ×12 IMPLANT
SUT ETHILON 2 0 PSLX (SUTURE) ×4 IMPLANT
SUT ETHILON 3 0 PS 1 (SUTURE) ×8 IMPLANT
SUT MNCRL AB 4-0 PS2 18 (SUTURE) ×2 IMPLANT
SUT MON AB 5-0 P3 18 (SUTURE) ×4 IMPLANT
SUT PDS AB 1 TP1 96 (SUTURE) ×2 IMPLANT
SUT PLAIN 5 0 P 3 18 (SUTURE) ×4 IMPLANT
SUT SILK 3 0 SH CR/8 (SUTURE) ×8 IMPLANT
SUT VIC AB 0 CT1 27 (SUTURE)
SUT VIC AB 0 CT1 27XBRD ANTBC (SUTURE) ×8 IMPLANT
SUT VIC AB 2-0 CT1 27 (SUTURE)
SUT VIC AB 2-0 CT1 36 (SUTURE) ×4 IMPLANT
SUT VIC AB 2-0 CT1 TAPERPNT 27 (SUTURE) ×8 IMPLANT
SUT VIC AB 2-0 FS1 27 (SUTURE) ×8 IMPLANT
SUT VICRYL 0 UR6 27IN ABS (SUTURE) ×2 IMPLANT
SYR BULB IRRIGATION 50ML (SYRINGE) ×2 IMPLANT
SYR CONTROL 10ML LL (SYRINGE) ×2 IMPLANT
TOWEL OR 17X24 6PK STRL BLUE (TOWEL DISPOSABLE) ×8 IMPLANT
TOWEL OR 17X26 10 PK STRL BLUE (TOWEL DISPOSABLE) ×12 IMPLANT
TROCAR BLADELESS 5MM (ENDOMECHANICALS) ×2 IMPLANT
TROCAR XCEL BLUNT TIP 100MML (ENDOMECHANICALS) ×2 IMPLANT
TUBE ANAEROBIC SPECIMEN COL (MISCELLANEOUS) IMPLANT
TUBE CONNECTING 12'X1/4 (SUCTIONS) ×2
TUBE CONNECTING 12X1/4 (SUCTIONS) ×6 IMPLANT
TUBE FEEDING 5FR 15 INCH (TUBING) IMPLANT
TUBING CYSTO DISP (UROLOGICAL SUPPLIES) ×4 IMPLANT
TUBING INSUFFLATION (TUBING) ×2 IMPLANT
UNDERPAD 30X30 (UNDERPADS AND DIAPERS) ×12 IMPLANT
YANKAUER SUCT BULB TIP NO VENT (SUCTIONS) ×6 IMPLANT

## 2016-08-22 NOTE — Progress Notes (Signed)
   08/21/16 2100  Clinical Encounter Type  Visited With Health care provider (Nurse)  Visit Type Initial  Referral From Nurse   Consulted w/ ED nurse  - Family unavailable   - Attempts to notify family unsuccessful

## 2016-08-22 NOTE — Progress Notes (Signed)
Replaced NG tube into right nare per order, KUB ordered for placement. Marisue Ivanobyn Ellery Tash RN
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
## 2016-08-22 NOTE — Progress Notes (Signed)
Replaced NG tube per Dr Fredricka Bonineonnor, x ray done to verify placement. Pt has pulled this NG tube out again. Pt is in bilateral wrist restraints. Paged Dr Fredricka Bonineonnor to find out if she wants it replaced. Waiting for call back. [employer] Vasudev[employer]
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
## 2016-08-22 NOTE — Anesthesia Preprocedure Evaluation (Signed)
Anesthesia Evaluation  Patient identified by MRN, date of birth, ID band Patient awake    Reviewed: Allergy & Precautions, NPO status , Patient's Chart, lab work & pertinent test results  Airway Mallampati: I  TM Distance: >3 FB Neck ROM: Limited   Comment: C Collar in place.  Dental  (+) Teeth Intact, Dental Advisory Given   Pulmonary neg pulmonary ROS,    breath sounds clear to auscultation       Cardiovascular negative cardio ROS   Rhythm:Regular Rate:Normal     Neuro/Psych negative neurological ROS  negative psych ROS   GI/Hepatic negative GI ROS, Neg liver ROS,   Endo/Other  negative endocrine ROS  Renal/GU negative Renal ROS  negative genitourinary   Musculoskeletal negative musculoskeletal ROS (+)   Abdominal Normal abdominal exam  (+)   Peds negative pediatric ROS (+)  Hematology negative hematology ROS (+)   Anesthesia Other Findings   Reproductive/Obstetrics negative OB ROS                             08/2016 EKG: sinus tachycardia.  Anesthesia Physical Anesthesia Plan  ASA: II and emergent  Anesthesia Plan: General   Post-op Pain Management:    Induction: Intravenous, Rapid sequence and Cricoid pressure planned  Airway Management Planned: Oral ETT  Additional Equipment:   Intra-op Plan:   Post-operative Plan: Extubation in OR  Informed Consent: I have reviewed the patients History and Physical, chart, labs and discussed the procedure including the risks, benefits and alternatives for the proposed anesthesia with the patient or authorized representative who has indicated his/her understanding and acceptance.   Dental advisory given  Plan Discussed with: CRNA  Anesthesia Plan Comments:         Anesthesia Quick Evaluation
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
## 2016-08-22 NOTE — Progress Notes (Signed)
Pt states his mother works at [employer] in [place], they are closed today and pt states his mother would not be at work today. Pt states he does not have a phone number for his mom or any other family at this time. Marisue Ivanobyn Djuan Talton RN

## 2016-08-22 NOTE — Progress Notes (Signed)
Able to get in touch with pt's mom, David DuncansSara [name]. Per pt's request she was given pt's location so she could visit. Pt made password. David Ivanobyn Makyah Lavigne RN

## 2016-08-22 NOTE — Progress Notes (Signed)
Received call from Dr Fredricka Bonineonnor stating it was ok to leave NG tube out at this time. David Woollard MyrickRN
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
## 2016-08-22 NOTE — Anesthesia Postprocedure Evaluation (Signed)
Anesthesia Post Note  Patient: David [name]  Procedure(s) Performed: Procedure(s) (LRB): IRRIGATION AND DEBRIDEMENT RIGHT LEG AND CASTING (Right) POSSIBLE EXTERNAL FIXATION LEG LAPAROSCOPY DIAGNOSTIC (N/A) REPAIR OF LEFT EAR (Left) Exploratory LAPAROTOMY repair of right colon serosal tear - pre-op blunt abdominal trauma (N/A)  Patient location during evaluation: PACU Anesthesia Type: General Level of consciousness: awake and alert Pain management: pain level controlled Vital Signs Assessment: post-procedure vital signs reviewed and stable Respiratory status: spontaneous breathing, nonlabored ventilation, respiratory function stable and patient connected to nasal cannula oxygen Cardiovascular status: blood pressure returned to baseline and stable Postop Assessment: no signs of nausea or vomiting Anesthetic complications: no    Last Vitals:  Vitals:   08/22/16 0030 08/22/16 0545  BP: 107/60   Pulse: (!) 141   Resp: 25   Temp:  (P) 37.1 C    Last Pain:  Vitals:   08/22/16 0545  PainSc: (P) 0-No pain                 Shelton SilvasKevin D Hollis
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
## 2016-08-22 NOTE — OR Nursing (Signed)
Received call from lab at 0738 of K+6.4. Paged Dr. Andrey CampanileWilson at [phone]. Called Dr. Aleene DavidsonE Fitzgerald at [phone]. Paged trauma surgeon [phone] and Dr. Andrey CampanileWilson called back at that point. Order rec'd for stat K+ level
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
## 2016-08-22 NOTE — OR Nursing (Signed)
Pt awake with good respiratory effort, very vocal. C/o nasal trumpet. Nasal trumpet d/c'd with patient subsequently breathing easy.  Pt extremely agitated and verbally abusive and unable to restrain himself from withdrawing NGT and monitoring devices. Entire presentation appearing to be without sound reason and unable to maintain safe parameters for the pt w/o wrist restraints. All reported to 3S RNs .  Have called number for mother and sister, no answer and no message availability. Will attempt contact later to update to condition and location.

## 2016-08-22 NOTE — Progress Notes (Signed)
   Subjective:  Patient reports pain as moderate. Argumentative and combative, currently in wrist restraints, demanding food  Objective:   VITALS:   Vitals:   08/22/16 0830 08/22/16 0959 08/22/16 1142 08/22/16 1200  BP: (!) 150/81  (!) 152/68   Pulse: (!) 118  (!) 110   Resp: 12  14 16   Temp: 98.6 F (37 C)  98.9 F (37.2 C)   TempSrc: Axillary  Axillary   SpO2: 100% 100% 95% 96%  Weight: 87.6 kg (193 lb 2 oz)     Height: 6\' 1"  (1.854 m)       Ex fix in place with wound vac, right foot wwp, palpable pedal pulses Left foot swelling, skin intact, NVI   Lab Results  Component Value Date   WBC 18.5 (H) 08/22/2016   HGB 13.3 08/22/2016   HCT 41.6 08/22/2016   MCV 88.9 08/22/2016   PLT 225 08/22/2016     Assessment/Plan:  Day of Surgery   - plan for IM nail of tibia if deemed medically stable for tomorrow - will likely need plastics consult for wound coverage - may need free flap given location of wound  Cheral AlmasXu, Naiping Michael 08/22/2016, 1:12 PM [phone]

## 2016-08-22 NOTE — Anesthesia Procedure Notes (Signed)
Procedure Name: Intubation Date/Time: 08/22/2016 2:12 AM Performed by: Arlice ColtMANESS, Graydon Fofana B Pre-anesthesia Checklist: Patient identified, Emergency Drugs available, Suction available, Patient being monitored and Timeout performed Patient Re-evaluated:Patient Re-evaluated prior to inductionOxygen Delivery Method: Circle system utilized Preoxygenation: Pre-oxygenation with 100% oxygen Intubation Type: IV induction, Cricoid Pressure applied and Rapid sequence Tube type: Oral Tube size: 7.5 mm Number of attempts: 1 Airway Equipment and Method: Stylet and Video-laryngoscopy Placement Confirmation: ETT inserted through vocal cords under direct vision,  positive ETCO2 and breath sounds checked- equal and bilateral Secured at: 22 cm Tube secured with: Tape Dental Injury: Teeth and Oropharynx as per pre-operative assessment
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
## 2016-08-22 NOTE — Brief Op Note (Signed)
   Brief Op Note  Date of Surgery: 08/22/2016  Preoperative Diagnosis: open right tibia fracture  Postoperative Diagnosis: same  Procedure: Procedure(s): IRRIGATION AND DEBRIDEMENT RIGHT LEG EXTERNAL FIXATION LEG   Implants: Stryker  Surgeons: David ArvinMichael Lafawn Lenoir, MD  Anesthesia: General  Drains: Wound vac  Estimated Blood Loss: See anesthesia record  Complications: None  Condition to PACU: Stable  [name] David ArvinMichael Avalin Briley, MD [facility] 08/22/2016 5:24 AM

## 2016-08-22 NOTE — Progress Notes (Signed)
Pt pulled out NG tube while in restraints ater transfer from PACU, paged Dr Doylene Canardonner. Received order to reinsert and get KUB to verify placement. Marisue Ivanobyn Albeiro Trompeter RN

## 2016-08-22 NOTE — ED Notes (Signed)
All belongings; including: jacket, cut shorts, bottle of cologne, bluetooth speaker w/ charger, and beaded necklace; placed in belongings bags and labeled.

## 2016-08-22 NOTE — Progress Notes (Signed)
Late entry Pt awake, agitated. Tried pulled lines out. Explained to pt for the ngt, [place]. Not cooperative. Applied Wrist  Soft restraints on. C/o nausea, pain. meds given via IV
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
## 2016-08-23 ENCOUNTER — Encounter (HOSPITAL_COMMUNITY): Payer: Self-pay | Admitting: Certified Registered Nurse Anesthetist

## 2016-08-23 ENCOUNTER — Inpatient Hospital Stay (HOSPITAL_COMMUNITY): Payer: No Typology Code available for payment source

## 2016-08-23 ENCOUNTER — Inpatient Hospital Stay (HOSPITAL_COMMUNITY): Payer: No Typology Code available for payment source | Admitting: Anesthesiology

## 2016-08-23 ENCOUNTER — Encounter (HOSPITAL_COMMUNITY)
Admission: EM | Disposition: A | Discharge status: Other Acute Inpatient Hospital | Payer: Self-pay | Source: Home / Self Care

## 2016-08-23 HISTORY — PX: APPLICATION OF WOUND VAC: SHX5189

## 2016-08-23 HISTORY — PX: TIBIA IM NAIL INSERTION: SHX2516

## 2016-08-23 HISTORY — PX: EXTERNAL FIXATION REMOVAL: SHX5040

## 2016-08-23 LAB — BASIC METABOLIC PANEL
Anion gap: 5 (ref 5–15)
CALCIUM: 7.9 mg/dL — AB (ref 8.9–10.3)
CHLORIDE: 105 mmol/L (ref 101–111)
CO2: 25 mmol/L (ref 22–32)
CREATININE: 1.12 mg/dL (ref 0.61–1.24)
GFR calc Af Amer: >60 mL/min (ref >60)
GFR calc non Af Amer: >60 mL/min (ref >60)
Glucose, Bld: 127 mg/dL — ABNORMAL HIGH (ref 65–99)
Potassium: 4.2 mmol/L (ref 3.5–5.1)
SODIUM: 135 mmol/L (ref 135–145)

## 2016-08-23 LAB — CBC
HCT: 30 % — ABNORMAL LOW (ref 39.0–52.0)
Hemoglobin: 9.8 g/dL — ABNORMAL LOW (ref 13.0–17.0)
MCH: 28.5 pg (ref 26.0–34.0)
MCHC: 32.7 g/dL (ref 30.0–36.0)
MCV: 87.2 fL (ref 78.0–100.0)
PLATELETS: 152 10*3/uL (ref 150–400)
RBC: 3.44 MIL/uL — ABNORMAL LOW (ref 4.22–5.81)
RDW: 12.5 % (ref 11.5–15.5)
WBC: 12.6 10*3/uL — AB (ref 4.0–10.5)

## 2016-08-23 SURGERY — INSERTION, INTRAMEDULLARY ROD, TIBIA
Anesthesia: General | Site: Leg Lower | Laterality: Right

## 2016-08-23 MED ORDER — LACTATED RINGERS IV SOLN
INTRAVENOUS | Status: DC | PRN
  Administered 2016-08-23 (×3): via INTRAVENOUS

## 2016-08-23 MED ORDER — MEPERIDINE HCL 25 MG/ML IJ SOLN: 6.2500 mg | INTRAMUSCULAR | Status: DC | PRN

## 2016-08-23 MED ORDER — SODIUM CHLORIDE 0.9 % IR SOLN
Status: DC | PRN
  Administered 2016-08-23 (×2): 3000 mL

## 2016-08-23 MED ORDER — LACTATED RINGERS IV SOLN: INTRAVENOUS | Status: DC

## 2016-08-23 MED ORDER — MIDAZOLAM HCL 5 MG/5ML IJ SOLN
INTRAMUSCULAR | Status: DC | PRN
  Administered 2016-08-23: 1 mg via INTRAVENOUS

## 2016-08-23 MED ORDER — ACETAMINOPHEN 325 MG PO TABS
650.0000 mg | ORAL_TABLET | Freq: Once | ORAL | Status: AC
  Administered 2016-08-23: 650 mg via ORAL
  Filled 2016-08-23: qty 2

## 2016-08-23 MED ORDER — PROMETHAZINE HCL 25 MG/ML IJ SOLN: 6.2500 mg | INTRAMUSCULAR | Status: DC | PRN

## 2016-08-23 MED ORDER — HYDROMORPHONE HCL 1 MG/ML IJ SOLN: 0.2500 mg | INTRAMUSCULAR | Status: DC | PRN

## 2016-08-23 MED ORDER — SUCCINYLCHOLINE CHLORIDE 200 MG/10ML IV SOSY
PREFILLED_SYRINGE | INTRAVENOUS | Status: DC | PRN
  Administered 2016-08-23: 120 mg via INTRAVENOUS

## 2016-08-23 MED ORDER — ROCURONIUM BROMIDE 10 MG/ML (PF) SYRINGE
PREFILLED_SYRINGE | INTRAVENOUS | Status: DC | PRN
  Administered 2016-08-23: 20 mg via INTRAVENOUS
  Administered 2016-08-23: 50 mg via INTRAVENOUS
  Administered 2016-08-23 (×2): 10 mg via INTRAVENOUS
  Administered 2016-08-23: 20 mg via INTRAVENOUS

## 2016-08-23 MED ORDER — FENTANYL CITRATE (PF) 100 MCG/2ML IJ SOLN
INTRAMUSCULAR | Status: DC | PRN
  Administered 2016-08-23 (×8): 50 ug via INTRAVENOUS

## 2016-08-23 MED ORDER — LIDOCAINE 2% (20 MG/ML) 5 ML SYRINGE
INTRAMUSCULAR | Status: DC | PRN
  Administered 2016-08-23: 50 mg via INTRAVENOUS

## 2016-08-23 MED ORDER — SUGAMMADEX SODIUM 200 MG/2ML IV SOLN
INTRAVENOUS | Status: DC | PRN
  Administered 2016-08-23: 200 mg via INTRAVENOUS

## 2016-08-23 MED ORDER — PROPOFOL 10 MG/ML IV BOLUS
INTRAVENOUS | Status: DC | PRN
  Administered 2016-08-23: 160 mg via INTRAVENOUS

## 2016-08-23 MED ORDER — 0.9 % SODIUM CHLORIDE (POUR BTL) OPTIME
TOPICAL | Status: DC | PRN
  Administered 2016-08-23: 1000 mL

## 2016-08-23 SURGICAL SUPPLY — 65 items
BANDAGE ACE 4X5 VEL STRL LF (GAUZE/BANDAGES/DRESSINGS) ×3 IMPLANT
BANDAGE ACE 6X5 VEL STRL LF (GAUZE/BANDAGES/DRESSINGS) ×3 IMPLANT
BANDAGE ELASTIC 4 VELCRO ST LF (GAUZE/BANDAGES/DRESSINGS) ×2 IMPLANT
BANDAGE ESMARK 6X9 LF (GAUZE/BANDAGES/DRESSINGS) ×1 IMPLANT
BIT DRILL LONG 4.0 (BIT) IMPLANT
BIT DRILL SHORT 4.0 (BIT) IMPLANT
BNDG CMPR 9X6 STRL LF SNTH (GAUZE/BANDAGES/DRESSINGS) ×1
BNDG CMPR MED 10X6 ELC LF (GAUZE/BANDAGES/DRESSINGS) ×1
BNDG ELASTIC 6X10 VLCR STRL LF (GAUZE/BANDAGES/DRESSINGS) ×2 IMPLANT
BNDG ESMARK 6X9 LF (GAUZE/BANDAGES/DRESSINGS) ×3
COVER MAYO STAND STRL (DRAPES) ×3 IMPLANT
COVER SURGICAL LIGHT HANDLE (MISCELLANEOUS) ×3 IMPLANT
CUFF TOURNIQUET SINGLE 34IN LL (TOURNIQUET CUFF) ×3 IMPLANT
DRAPE C-ARM 42X72 X-RAY (DRAPES) ×3 IMPLANT
DRAPE C-ARMOR (DRAPES) ×3 IMPLANT
DRAPE EXTREMITY T 121X128X90 (DRAPE) ×3 IMPLANT
DRAPE IMP U-DRAPE 54X76 (DRAPES) ×3 IMPLANT
DRAPE POUCH INSTRU U-SHP 10X18 (DRAPES) ×3 IMPLANT
DRAPE U-SHAPE 47X51 STRL (DRAPES) ×3 IMPLANT
DRAPE UTILITY XL STRL (DRAPES) ×6 IMPLANT
DRILL BIT LONG 4.0 (BIT) ×3
DRILL BIT SHORT 4.0 (BIT) ×6
DRSG ADAPTIC 3X8 NADH LF (GAUZE/BANDAGES/DRESSINGS) ×2 IMPLANT
DRSG PAD ABDOMINAL 8X10 ST (GAUZE/BANDAGES/DRESSINGS) ×2 IMPLANT
DURAPREP 26ML APPLICATOR (WOUND CARE) ×3 IMPLANT
ELECT CAUTERY BLADE 6.4 (BLADE) ×3 IMPLANT
ELECT REM PT RETURN 9FT ADLT (ELECTROSURGICAL) ×3
ELECTRODE REM PT RTRN 9FT ADLT (ELECTROSURGICAL) ×1 IMPLANT
FACESHIELD WRAPAROUND (MASK) IMPLANT
FACESHIELD WRAPAROUND OR TEAM (MASK) IMPLANT
GAUZE SPONGE 4X4 12PLY STRL (GAUZE/BANDAGES/DRESSINGS) ×3 IMPLANT
GAUZE XEROFORM 1X8 LF (GAUZE/BANDAGES/DRESSINGS) ×6 IMPLANT
GAUZE XEROFORM 5X9 LF (GAUZE/BANDAGES/DRESSINGS) ×2 IMPLANT
GLOVE SKINSENSE NS SZ7.5 (GLOVE) ×8
GLOVE SKINSENSE STRL SZ7.5 (GLOVE) ×4 IMPLANT
GOWN STRL REIN XL XLG (GOWN DISPOSABLE) ×3 IMPLANT
GUIDE PIN 3.2X343 (PIN) ×1
GUIDE PIN 3.2X343MM (PIN) ×3
GUIDE ROD 3.0 (MISCELLANEOUS) ×3
KIT BASIN OR (CUSTOM PROCEDURE TRAY) ×3 IMPLANT
MANIFOLD NEPTUNE II (INSTRUMENTS) ×3 IMPLANT
NAIL META 10X36 (Nail) ×2 IMPLANT
NS IRRIG 1000ML POUR BTL (IV SOLUTION) ×3 IMPLANT
PACK TOTAL JOINT (CUSTOM PROCEDURE TRAY) ×3 IMPLANT
PACK UNIVERSAL I (CUSTOM PROCEDURE TRAY) ×3 IMPLANT
PAD ABD 8X10 STRL (GAUZE/BANDAGES/DRESSINGS) ×2 IMPLANT
PAD CAST 4YDX4 CTTN HI CHSV (CAST SUPPLIES) ×2 IMPLANT
PAD NEG PRESSURE SENSATRAC (MISCELLANEOUS) ×4 IMPLANT
PADDING CAST COTTON 4X4 STRL (CAST SUPPLIES) ×6
PIN GUIDE 3.2X343MM (PIN) IMPLANT
ROD GUIDE 3.0 (MISCELLANEOUS) IMPLANT
SCREW TRIGEN LOW PROF 5.0X42.5 (Screw) ×4 IMPLANT
SCREW TRIGEN LOW PROF 5.0X52.5 (Screw) ×2 IMPLANT
SCREW TRIGEN LOW PROF 5.0X70 (Screw) ×2 IMPLANT
SET IRRIG Y TYPE TUR BLADDER L (SET/KITS/TRAYS/PACK) ×2 IMPLANT
SPONGE GAUZE 4X4 12PLY STER LF (GAUZE/BANDAGES/DRESSINGS) ×4 IMPLANT
STAPLER SKIN PROX WIDE 3.9 (STAPLE) ×3 IMPLANT
STAPLER VISISTAT 35W (STAPLE) ×2 IMPLANT
SUT VIC AB 0 CT1 27 (SUTURE) ×9
SUT VIC AB 0 CT1 27XBRD ANBCTR (SUTURE) IMPLANT
SUT VIC AB 0 CT1 27XBRD ANTBC (SUTURE) ×2 IMPLANT
SUT VIC AB 2-0 CT1 27 (SUTURE) ×9
SUT VIC AB 2-0 CT1 TAPERPNT 27 (SUTURE) ×2 IMPLANT
TOWEL OR 17X26 10 PK STRL BLUE (TOWEL DISPOSABLE) ×6 IMPLANT
WATER STERILE IRR 1000ML POUR (IV SOLUTION) ×3 IMPLANT
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
## 2016-08-23 NOTE — Anesthesia Postprocedure Evaluation (Signed)
Anesthesia Post Note  Patient: Barnie DelCameron M XXXBrown  Procedure(s) Performed: Procedure(s) (LRB): INTRAMEDULLARY (IM) NAIL TIBIAL (Right) REMOVAL EXTERNAL FIXATION LEG (Right) WOUND VAC EXCHANGE (Right)  Patient location during evaluation: PACU Anesthesia Type: General Level of consciousness: awake and alert Pain management: pain level controlled Vital Signs Assessment: post-procedure vital signs reviewed and stable Respiratory status: spontaneous breathing, nonlabored ventilation, respiratory function stable and patient connected to nasal cannula oxygen Cardiovascular status: blood pressure returned to baseline and stable Postop Assessment: no signs of nausea or vomiting Anesthetic complications: no    Last Vitals:  Vitals:   08/23/16 1117 08/23/16 1118  BP: (!) 164/56   Pulse: (!) 128 (!) 128  Resp: 17 17  Temp: 36.6 C     Last Pain:  Vitals:   08/23/16 1117  TempSrc: Axillary  PainSc: 0-No pain                 Shelton SilvasKevin D Kalum Minner

## 2016-08-23 NOTE — Anesthesia Preprocedure Evaluation (Addendum)
Anesthesia Evaluation  Patient identified by MRN, date of birth, ID band Patient awake    Reviewed: Allergy & Precautions, NPO status , Patient's Chart, lab work & pertinent test results  Airway Mallampati: II  TM Distance: >3 FB Neck ROM: Full    Dental  (+) Teeth Intact, Dental Advisory Given   Pulmonary neg pulmonary ROS,    breath sounds clear to auscultation       Cardiovascular negative cardio ROS   Rhythm:Regular Rate:Normal     Neuro/Psych negative neurological ROS  negative psych ROS   GI/Hepatic negative GI ROS, Neg liver ROS,   Endo/Other  negative endocrine ROS  Renal/GU negative Renal ROS  negative genitourinary   Musculoskeletal negative musculoskeletal ROS (+)   Abdominal   Peds negative pediatric ROS (+)  Hematology negative hematology ROS (+)   Anesthesia Other Findings   Reproductive/Obstetrics negative OB ROS                            Anesthesia Physical Anesthesia Plan  ASA: II  Anesthesia Plan: General   Post-op Pain Management:    Induction: Intravenous  Airway Management Planned: Oral ETT  Additional Equipment:   Intra-op Plan:   Post-operative Plan: Extubation in OR  Informed Consent: I have reviewed the patients History and Physical, chart, labs and discussed the procedure including the risks, benefits and alternatives for the proposed anesthesia with the patient or authorized representative who has indicated his/her understanding and acceptance.   Dental advisory given  Plan Discussed with: CRNA  Anesthesia Plan Comments:         Anesthesia Quick Evaluation  

## 2016-08-23 NOTE — Progress Notes (Signed)
   08/23/16 0405  Vitals  Temp (!) 103 F (39.4 C)  Temp Source Oral  BP (!) 157/75  MAP (mmHg) 97  BP Location Right Arm  BP Method Automatic  Patient Position (if appropriate) Lying  Pulse Rate (!) 129  Pulse Rate Source Monitor  ECG Heart Rate (!) 132  Cardiac Rhythm NSR  Resp 20  Pt temp still 103 doing [id number] on IS paged md  wgo ordered cooling blanket
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
## 2016-08-23 NOTE — Progress Notes (Signed)
   08/23/16 0230  Vitals  Temp (!) 103.1 F (39.5 C)  Temp Source Oral  Pulse Rate (!) 141  ECG Heart Rate (!) 144  Resp (!) 25  paged Dr Fredricka Bonineonnor who ordered 650 tylenol x 1 po I verified with with Dr Fredricka Bonineonnor and she confirmed po
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
## 2016-08-23 NOTE — Progress Notes (Signed)
Wasted 13 mg morphine with  Herbert SetaHeather richard Rn

## 2016-08-23 NOTE — Op Note (Signed)
Date of Surgery: 08/23/2016  INDICATIONS: [name] is a 20 y.o.-year-old male who returns today for definitive fixation of his tibial fracture;  The patient did consent to the procedure after discussion of the risks and benefits.  PREOPERATIVE DIAGNOSIS: Right type III open tibial shaft fracture with soft tissue loss status post external fixation  POSTOPERATIVE DIAGNOSIS: Same.  PROCEDURE:  1. Removal of external fixator under general anesthesia 2. Intramedullary nailing of right open tibia fracture 3. Excisional debridement of bone, skin, muscle associated with open fracture 4. Debridement of bone, subcutaneous tissue of external fixator pin sites 5. Application of wound VAC less than 50 cm 6. Closed treatment of fibular shaft fracture with manipulation  SURGEON: N. Glee ArvinMichael Eliga Arvie, M.D.  ASSIST: None.  ANESTHESIA:  general  IV FLUIDS AND URINE: See anesthesia.  ESTIMATED BLOOD LOSS: See anesthesia record  IMPLANTS: [employer] 10 x 39 tibial nail  DRAINS: None  COMPLICATIONS: None.  DESCRIPTION OF PROCEDURE: The patient was brought to the operating room and placed supine on the operating table.  The patient had been signed prior to the procedure and this was documented. The patient had the anesthesia placed by the anesthesiologist.  A time-out was performed to confirm that this was the correct patient, site, side and location. The patient did receive antibiotics prior to the incision and was re-dosed during the procedure as needed at indicated intervals.  A tourniquet not placed.  The patient had the operative extremity prepped and draped in the standard surgical fashion.    I first began with removal of the external fixator. Once he had general anesthesia I took out the external fixator carefully. I did leave the Schanz pin in his calcaneus during the surgery in order to help facilitate reduction. This was removed at the end of the surgery. I then turned my attention to the  tibia fracture. Sharp excisional debridement with Roger and knife of the bone, skin, muscle was again performed associated with the open tibia fracture. There was no gross contamination. No signs of infection. This was thoroughly irrigated with normal saline.  I then made a longitudinal incision over the patellar tendon. A patellar tendon splitting approach was used to gain access to the proximal tibia. Fluoroscopy was used to find the start site with a guidepin. The guidepin was then advanced down the proximal tibia. Opening reamer was used to gain entry into the canal. We then obtained a reduction of the fracture and this was held using a large tenaculum clamp. This was confirmed under fluoroscopy. The length of the nail was then measured after insertion of the guidewire down to the appropriate depth. The canal was then sequentially reamed until appropriate chatter at 11 mm. The tibial nail was then inserted over the guidewire down to the appropriate depth. The fracture remained reduced. I then placed 2 proximal interlocking screws through the jig. I then compressed the fracture bimalleolar during the proximal end of the jig. I then placed 2 distal interlocking screws using the perfect circle technique. The fibular shaft fracture was treated in a closed manner using manipulation. I then removed the final Schanz pin from the calcaneus. I then performed sharp excisional debridement with a curet and Roger of the pin sites including bone, subcutaneous tissue, skin. Of note the muscular compartments remained soft during the entire procedure. I then obtained an intraoperative consult from plastic surgery for the wound. The wound measured approximately 5 x 6 cm and was directly over the fracture. There  was significant periosteal stripping. I then applied a wound VAC with Adaptic. The surgical incisions were closed with 0 Vicryl, 2-0 Vicryl, skin staples. The pin sites were left open and dressed with Xeroform and allowed  to heal by secondary intention. Sterile dressings were applied. The patient tolerated the procedure well had no immediate complications.  POSTOPERATIVE PLAN: The patient will be weight-bear as tolerated to the left lower extremity and nonweightbearing to the right lower extremity. He will need transfer to a tertiary care center for free flap by plastic surgery. I will follow the patient for his orthopedic care.  Mayra Reel, MD [facility] [phone] 11:10 AM

## 2016-08-23 NOTE — Transfer of Care (Signed)
Immediate Anesthesia Transfer of Care Note  Patient: David [name]  Procedure(s) Performed: Procedure(s): INTRAMEDULLARY (IM) NAIL TIBIAL (Right) REMOVAL EXTERNAL FIXATION LEG (Right)  Patient Location: PACU  Anesthesia Type:General  Level of Consciousness: awake, alert , oriented and patient cooperative  Airway & Oxygen Therapy: Patient Spontanous Breathing and Patient connected to nasal cannula oxygen  Post-op Assessment: Report given to RN and Post -op Vital signs reviewed and stable  Post vital signs: Reviewed and stable  Last Vitals:  Vitals:   08/23/16 0400 08/23/16 0405  BP:  (!) 157/75  Pulse:  (!) 129  Resp: (!) 25 20  Temp:  (!) 39.4 C    Last Pain:  Vitals:   08/23/16 0405  TempSrc: Oral  PainSc: 5       Patients Stated Pain Goal: 0 (08/22/16 1445)  Complications: No apparent anesthesia complications

## 2016-08-23 NOTE — Anesthesia Procedure Notes (Addendum)
Procedure Name: Intubation Date/Time: 08/23/2016 7:36 AM Performed by: Annabelle HarmanSMITH, Anthonymichael Munday A Pre-anesthesia Checklist: Patient identified, Emergency Drugs available, Suction available and Patient being monitored Patient Re-evaluated:Patient Re-evaluated prior to inductionOxygen Delivery Method: Circle system utilized Preoxygenation: Pre-oxygenation with 100% oxygen Intubation Type: IV induction Ventilation: Mask ventilation without difficulty Laryngoscope Size: Glidescope and 4 Grade View: Grade I Tube type: Oral Tube size: 7.5 mm Number of attempts: 1 Airway Equipment and Method: Rigid stylet and Video-laryngoscopy Placement Confirmation: ETT inserted through vocal cords under direct vision and positive ETCO2 Secured at: 23 cm Tube secured with: Tape Dental Injury: Teeth and Oropharynx as per pre-operative assessment  Difficulty Due To: Difficulty was anticipated, Difficult Airway- due to cervical collar and Difficult Airway-  due to neck instability Comments: Head and neck kept in neutral alignment during intubation.  C-collar in place.

## 2016-08-24 ENCOUNTER — Inpatient Hospital Stay (HOSPITAL_COMMUNITY): Payer: No Typology Code available for payment source

## 2016-08-24 ENCOUNTER — Encounter (HOSPITAL_COMMUNITY): Payer: Self-pay | Admitting: *Deleted

## 2016-08-24 DIAGNOSIS — S82401B Unspecified fracture of shaft of right fibula, initial encounter for open fracture type I or II: Secondary | ICD-10-CM

## 2016-08-24 DIAGNOSIS — R338 Other retention of urine: Secondary | ICD-10-CM | POA: Diagnosis not present

## 2016-08-24 DIAGNOSIS — S36892A Contusion of other intra-abdominal organs, initial encounter: Secondary | ICD-10-CM | POA: Diagnosis present

## 2016-08-24 DIAGNOSIS — S92202A Fracture of unspecified tarsal bone(s) of left foot, initial encounter for closed fracture: Secondary | ICD-10-CM | POA: Diagnosis present

## 2016-08-24 DIAGNOSIS — S82201B Unspecified fracture of shaft of right tibia, initial encounter for open fracture type I or II: Secondary | ICD-10-CM | POA: Diagnosis present

## 2016-08-24 DIAGNOSIS — T07XXXA Unspecified multiple injuries, initial encounter: Secondary | ICD-10-CM

## 2016-08-24 DIAGNOSIS — S01302A Unspecified open wound of left ear, initial encounter: Secondary | ICD-10-CM | POA: Diagnosis present

## 2016-08-24 DIAGNOSIS — S36539A Laceration of unspecified part of colon, initial encounter: Secondary | ICD-10-CM | POA: Diagnosis present

## 2016-08-24 DIAGNOSIS — S129XXA Fracture of neck, unspecified, initial encounter: Secondary | ICD-10-CM | POA: Diagnosis present

## 2016-08-24 DIAGNOSIS — D62 Acute posthemorrhagic anemia: Secondary | ICD-10-CM | POA: Diagnosis not present

## 2016-08-24 LAB — CBC WITH DIFFERENTIAL/PLATELET
Basophils Absolute: 0 10*3/uL (ref 0.0–0.1)
Basophils Relative: 0 %
Eosinophils Absolute: 0.4 10*3/uL (ref 0.0–0.7)
Eosinophils Relative: 5 %
HEMATOCRIT: 20.1 % — AB (ref 39.0–52.0)
HEMOGLOBIN: 6.5 g/dL — AB (ref 13.0–17.0)
LYMPHS ABS: 1.4 10*3/uL (ref 0.7–4.0)
LYMPHS PCT: 16 %
MCH: 28 pg (ref 26.0–34.0)
MCHC: 32.3 g/dL (ref 30.0–36.0)
MCV: 86.6 fL (ref 78.0–100.0)
MONOS PCT: 6 %
Monocytes Absolute: 0.5 10*3/uL (ref 0.1–1.0)
NEUTROS PCT: 74 %
Neutro Abs: 6.8 10*3/uL (ref 1.7–7.7)
Platelets: 132 10*3/uL — ABNORMAL LOW (ref 150–400)
RBC: 2.32 MIL/uL — AB (ref 4.22–5.81)
RDW: 12.1 % (ref 11.5–15.5)
WBC: 9.2 10*3/uL (ref 4.0–10.5)

## 2016-08-24 LAB — BASIC METABOLIC PANEL
ANION GAP: 6 (ref 5–15)
BUN: <5 mg/dL — ABNORMAL LOW (ref 6–20)
CALCIUM: 8.1 mg/dL — AB (ref 8.9–10.3)
CHLORIDE: 103 mmol/L (ref 101–111)
CO2: 29 mmol/L (ref 22–32)
Creatinine, Ser: 0.92 mg/dL (ref 0.61–1.24)
GFR calc non Af Amer: >60 mL/min (ref >60)
GLUCOSE: 123 mg/dL — AB (ref 65–99)
Potassium: 4.1 mmol/L (ref 3.5–5.1)
Sodium: 138 mmol/L (ref 135–145)

## 2016-08-24 LAB — MAGNESIUM: Magnesium: 1.6 mg/dL — ABNORMAL LOW (ref 1.7–2.4)

## 2016-08-24 LAB — PREPARE RBC (CROSSMATCH)

## 2016-08-24 LAB — ABO/RH: ABO/RH(D): O POS

## 2016-08-24 LAB — SAMPLE TO BLOOD BANK

## 2016-08-24 MED ORDER — LORAZEPAM 2 MG/ML IJ SOLN
INTRAMUSCULAR | Status: AC
  Filled 2016-08-24: qty 1

## 2016-08-24 MED ORDER — ENOXAPARIN SODIUM 30 MG/0.3ML ~~LOC~~ SOLN
30.0000 mg | Freq: Two times a day (BID) | SUBCUTANEOUS | Status: DC
  Administered 2016-08-24 – 2016-08-26 (×5): 30 mg via SUBCUTANEOUS
  Filled 2016-08-24 (×5): qty 0.3

## 2016-08-24 MED ORDER — SODIUM CHLORIDE 0.9 % IV SOLN
Freq: Once | INTRAVENOUS | Status: AC
  Administered 2016-08-24: 15:00:00 via INTRAVENOUS

## 2016-08-24 MED ORDER — DOCUSATE SODIUM 100 MG PO CAPS
100.0000 mg | ORAL_CAPSULE | Freq: Two times a day (BID) | ORAL | Status: DC
  Administered 2016-08-24 – 2016-08-26 (×4): 100 mg via ORAL
  Filled 2016-08-24 (×5): qty 1

## 2016-08-24 MED ORDER — LORAZEPAM 2 MG/ML IJ SOLN
1.0000 mg | Freq: Once | INTRAMUSCULAR | Status: AC
  Administered 2016-08-24: 1 mg via INTRAVENOUS

## 2016-08-24 MED ORDER — POLYETHYLENE GLYCOL 3350 17 G PO PACK
17.0000 g | PACK | Freq: Every day | ORAL | Status: DC
  Administered 2016-08-24 – 2016-08-26 (×3): 17 g via ORAL
  Filled 2016-08-24 (×3): qty 1

## 2016-08-24 NOTE — Progress Notes (Signed)
CRITICAL VALUE ALERT  Critical value received:  hgb 6.5  Date of notification:  08/24/2016  Time of notification:  1055  Critical value read back:Yes.    Nurse who received alert:  Osvaldo Humanarole Aashna Matson rn  MD notified (1st page):  Charma IgoMichael jeffery PA  Time of first page:  1105  MD notified (2nd page):  Time of second page:  Responding MD:  [id number]  Time MD responded:  Charma IgoMichael jeffery PA, new orders rec'd

## 2016-08-24 NOTE — Progress Notes (Signed)
Notified physician of patient's heart rate 120's-130's. Patient reports no chest pain, anxiety, shortness of breath, or feeling jittery at this time. Also notified of earlier temp of 101 although temp has now decreased to 98.1. Physician acknowledged HR and temp values; no additional orders given by physician at this time.
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
## 2016-08-24 NOTE — Progress Notes (Signed)
Nurse alerted me that patient had not been able to void since Foley catheter removed around 7 PM on Sunday. Nurse had attempted to place Foley twice but stopped when she got resistance. I attempted to place a 5[address] [place] Foley but also could not get it to go all the way in. It felt like it was coiling. Bladder scan showed the patient had 725 mL of urine. I spoke with Dr. Berneice HeinrichManny with urology to place a Foley catheter  Mary SellaEric M. Andrey CampanileWilson, MD, FACS General, Bariatric, & Minimally Invasive Surgery [facility] [place]
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
## 2016-08-24 NOTE — Progress Notes (Signed)
Report called pt to transfer to 6N17 via bed with belongings.
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
## 2016-08-24 NOTE — Progress Notes (Signed)
Patient ID: David [name], male   DOB: 1996/05/10, 20 y.o.   MRN: [id number] [facility] by Dr. Romelle StarcherMartin Avery once a bed is available. Violeta GelinasBurke Tyden Kann, MD, MPH, FACS Trauma: [phone] General Surgery: [phone]

## 2016-08-24 NOTE — Progress Notes (Signed)
   Subjective:  Patient reports pain as mild.    Objective:   VITALS:   Vitals:   08/23/16 1923 08/23/16 2300 08/24/16 0312 08/24/16 0426  BP: (!) 145/72 (!) 151/69 (!) 158/69   Pulse: (!) 115 (!) 108 (!) 119   Resp: 12 14 19    Temp: 98.8 F (37.1 C) 98.5 F (36.9 C)  (!) 101 F (38.3 C)  TempSrc: Oral Oral  Axillary  SpO2: 100% 100% 100%   Weight:      Height:        Neurologically intact Neurovascular intact Sensation intact distally Intact pulses distally Dorsiflexion/Plantar flexion intact Incision: dressing C/D/I and no drainage No cellulitis present Compartment soft   Lab Results  Component Value Date   WBC 12.6 (H) 08/23/2016   HGB 9.8 (L) 08/23/2016   HCT 30.0 (L) 08/23/2016   MCV 87.2 08/23/2016   PLT 152 08/23/2016     Assessment/Plan:  1 Day Post-Op   - Expected postop acute blood loss anemia - will monitor for symptoms - DVT ppx - SCDs, ambulation, lovenox - NWB RLE, WBAT LLE - transfer to tertiary care center for free flap - I will have patient follow up with me for postop ortho care  Cheral AlmasXu, Naiping Michael 08/24/2016, 7:24 AM [phone]

## 2016-08-24 NOTE — Progress Notes (Signed)
Patient ID: David [name], male   DOB: 1996/02/10, 20 y.o.   MRN: [id number] Difficult exam with patient's compliance but moving all extremities pain well-controlled  Collar in good position MRI scan showed no significant central or foraminal stenosis and confirmed vertebral body compression deformity chronic.  Continue cervical collar immobilized per trauma no new neurosurgical recommendations.
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
## 2016-08-24 NOTE — Progress Notes (Signed)
Rehab Admissions Coordinator Note:  Patient was screened by Clois DupesBoyette, Kyrese Gartman Godwin for appropriateness for an Inpatient Acute Rehab Consult. Per PT recommendations. Noted [facility]. Will hold.  Clois DupesBoyette, Dylana Shaw Godwin 08/24/2016, 12:42 PM  I can be reached at [phone].

## 2016-08-24 NOTE — Progress Notes (Signed)
Orthopedic Tech Progress Note Patient Details:  David [name] January 11, 1996 [id number]  Ortho Devices Type of Ortho Device: CAM walker Splint Material: Fiberglass Ortho Device/Splint Location: rle Ortho Device/Splint Interventions: Application   Nikki Domrawford, Robby Bulkley 08/24/2016, 8:37 AM

## 2016-08-24 NOTE — Progress Notes (Signed)
08/24/2016  [facility] [place] Levels I-VI of Cognitive Functioning-Daily Tracking Sheet         Date of Onset ___9/8/17______  Level of function Behavioral Characteristics Initial Eval.  Date: __9/11/17__ Date and initial when observed   Level I No response Total Assistance Complete absence of observable change in behavior when presented visual, auditory, tactile, proprioceptive, vestibular or painful stimuli.             Level II Generalized response  Total Assistance Demonstrates generalized reflex response to painful stimuli       Responds to repeated auditory stimuli with increased or decreased activity       Responds to external stimuli with physiological changes generalized, gross body movement and / or not purposeful vocalization               Responses noted above may be same regardless of type and location of stimuli        Responses may be significantly delayed      Level III Localized response  Total Assistance Demonstrates withdrawal or vocalization to painful stimuli       Turns toward or away from auditory stimuli        Blinks when strong light crosses visual field        Follows moving object passed within visual field        Responds to discomfort by pulling tubes or restraints       Responds inconsistently to simple commands       Responses directly related to type of stimulus       May respond to some persons (especially friends and family) but not to others       Level IV Confused/Agitated  Maximal Assistance       Alert and in heightened state of anxiety        Purposeful attempts to remove restraints or tubes or crawl out of bed        May perform motor activities such as sitting, reaching and walking without any apparent purpose or upon another's request        Brief and usually non purposeful moments of focused and sustained attention       Post traumatic amnesia state        Absent goal directed, problem  solving, self monitoring behavior       May cry or scream out of proportion to stimulus even after it's removal       May exhibit aggressive or flight behavior        Mood swing from euphoric to hostile with no apparent relationship to environmental events        Verbalizations are frequently incoherent and/or inappropriate to activity or environment             Level V   Confused/InappropriateNon-Agitated  Maximal Assistance Alert, not agitated but may wander randomly or with vague intention of going home        May become agitated in response to external stimulation and/or lack of environmental structure. 08/24/16 RBM      Not orientated to person, place and time.       Frequent brief periods, non-purposeful sustained attention. 08/24/16 RBM      Severely impaired recent memory, with confusion of past and present in reaction to ongoing activity.  08/24/16 RBM      Absent goal directed, problem solving behavior.  08/24/16 RBM      Often demonstrates inappropriate use of objects without external  direction.       May be able to perform previously learned tasks when structure and cues provided. 08/24/16 RBM      Unable to learn new information       Able to respond appropriately to simple commands fairly consistently with external structure and cues. 08/24/16 RBM      Responses to simple commands without external structure are random and non-purposeful in relation to the command       Able to converse on a social, automatic level for brief periods of time when provided external structure and cues.  08/24/16 RBM      Verbalizations about present events become inappropriate and confabulatory when external structure and cues are not provided.  08/24/16 RBM              Level of function Level VI Confused/Appropriate  Maximal Assistance Behavioral Characteristics Initial Eval.  Date: _____ Date and initial when observed    Able to attend to highly familiar tasks in  non-distracting environment for 30 minutes with moderate redirection.       Remote memory has more depth and detail than recent memory        Vague recognition of some staff.       Able to use assistive memory aide with maximal assist.       Emerging awareness of appropriate response to self, family and basic needs.        Emerging goal directed behavior.        Moderate assist to problem solve barriers to task completion.        Supervised for old learning (e.g. self care).       Shows carry over for relearned familiar tasks (e.g. self care).       Maximal assistance for new learning with little or no carry over.       Unaware of impairments, disabilities and safety risks.       Consistently follows simple directions       Verbal expressions are appropriate in highly familiar and structured situations.       DO  NOT SIGN NOTE. ALWAYS SHARE UNLESS PATIENT HAS PROGRESSED BEYOND A RANCHO LEVEL VI. THIS WAY, ALL TEAM MEMBERS HAVE ACCESS TO DOCUMENT ON TRACKING SHEET. THANK YOU.

## 2016-08-25 ENCOUNTER — Encounter (HOSPITAL_COMMUNITY): Payer: Self-pay | Admitting: Orthopaedic Surgery

## 2016-08-25 LAB — TYPE AND SCREEN
ABO/RH(D): O POS
Antibody Screen: NEGATIVE
UNIT DIVISION: 0

## 2016-08-25 LAB — CBC
HCT: 21.8 % — ABNORMAL LOW (ref 39.0–52.0)
HEMOGLOBIN: 7.3 g/dL — AB (ref 13.0–17.0)
MCH: 29.1 pg (ref 26.0–34.0)
MCHC: 33.5 g/dL (ref 30.0–36.0)
MCV: 86.9 fL (ref 78.0–100.0)
Platelets: 174 10*3/uL (ref 150–400)
RBC: 2.51 MIL/uL — ABNORMAL LOW (ref 4.22–5.81)
RDW: 12.5 % (ref 11.5–15.5)
WBC: 8.5 10*3/uL (ref 4.0–10.5)

## 2016-08-25 MED ORDER — MORPHINE SULFATE (PF) 2 MG/ML IV SOLN
2.0000 mg | INTRAVENOUS | Status: DC | PRN
Start: 2016-08-25 — End: 2016-08-26
  Administered 2016-08-25 – 2016-08-26 (×3): 2 mg via INTRAVENOUS
  Filled 2016-08-25 (×3): qty 1

## 2016-08-25 MED ORDER — OXYCODONE HCL 5 MG PO TABS
5.0000 mg | ORAL_TABLET | ORAL | Status: DC | PRN
  Administered 2016-08-25: 15 mg via ORAL
  Administered 2016-08-26 (×2): 10 mg via ORAL
  Filled 2016-08-25 (×2): qty 3
  Filled 2016-08-25: qty 2
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
## 2016-08-26 ENCOUNTER — Encounter (HOSPITAL_COMMUNITY): Payer: Self-pay | Admitting: General Surgery

## 2016-08-26 LAB — URINALYSIS, ROUTINE W REFLEX MICROSCOPIC
BILIRUBIN URINE: NEGATIVE
Glucose, UA: NEGATIVE mg/dL
Ketones, ur: NEGATIVE mg/dL
Leukocytes, UA: NEGATIVE
Nitrite: NEGATIVE
PH: 8 (ref 5.0–8.0)
Protein, ur: NEGATIVE mg/dL
SPECIFIC GRAVITY, URINE: 1.008 (ref 1.005–1.030)

## 2016-08-26 LAB — CBC
HEMATOCRIT: 22.9 % — AB (ref 39.0–52.0)
HEMOGLOBIN: 7.6 g/dL — AB (ref 13.0–17.0)
MCH: 28.8 pg (ref 26.0–34.0)
MCHC: 33.2 g/dL (ref 30.0–36.0)
MCV: 86.7 fL (ref 78.0–100.0)
Platelets: 219 10*3/uL (ref 150–400)
RBC: 2.64 MIL/uL — AB (ref 4.22–5.81)
RDW: 12.6 % (ref 11.5–15.5)
WBC: 8.6 10*3/uL (ref 4.0–10.5)

## 2016-08-26 LAB — URINE MICROSCOPIC-ADD ON: SQUAMOUS EPITHELIAL / LPF: NONE SEEN

## 2016-08-26 MED ORDER — ACETAMINOPHEN 325 MG PO TABS: 650.0000 mg | ORAL_TABLET | Freq: Four times a day (QID) | ORAL | Status: DC | PRN
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
## 2016-08-26 NOTE — Progress Notes (Signed)
Orthopedic Tech Progress Note Patient Details:  Barnie DelCameron M XXXBrown 1996-01-01 [id number] Called in bio-tech brace order; spoke with Judeth CornfieldStephanie Patient ID: Barnie Delameron M XXXBrown, male   DOB: 1996-01-01, 20 y.o.   MRN: [id number]   Nikki DomCrawford, Lynwood Kubisiak 08/26/2016, 12:41 PM

## 2016-08-26 NOTE — Progress Notes (Signed)
PT Cancellation Note  Patient Details Name: David [name] M XXXBrown MRN: [id number] DOB: 09-20-1996   Cancelled Treatment:    Reason Eval/Treat Not Completed: Patient at procedure or test/unavailable (Rn into change wound vac.  )   Herby Amick Artis DelayJ Aubryn Spinola 08/26/2016, 5:57 PM  Joycelyn RuaAimee Devora Tortorella, PTA pager [phone]

## 2016-08-26 NOTE — Progress Notes (Signed)
PT Cancellation Note  Patient Details Name: David [name] M XXXBrown MRN: [id number] DOB: 1996-09-17   Cancelled Treatment:    Reason Eval/Treat Not Completed: Patient at procedure or test/unavailable (Pt educating new RN on wound vac placement.  )   Rebie Peale Artis DelayJ Amilliana Hayworth 08/26/2016, 5:58 PM

## 2016-08-26 NOTE — Progress Notes (Signed)
[facility] per Marliss CootsVera Daniel, no bed available at this time. Will call [phone] to follow -up.

## 2016-08-26 NOTE — Progress Notes (Addendum)
PT Cancellation Note  Patient Details Name: David [name] MRN: [id number] DOB: 08-Jun-1996   Cancelled Treatment:    Reason Eval/Treat Not Completed: Patient declined, no reason specified (Pt reports he just had morphine and wished to rest.  PTA educated on benefits and offered bed exercises and patient remains to decline, pt is difficult to establish rapport with. )   Florestine AversAimee J Vonna Brabson 08/26/2016, 6:00 PM Joycelyn RuaAimee Margaurite Salido, PTA pager [phone]

## 2016-08-26 NOTE — Progress Notes (Signed)
[facility] called and informed RN that a bed on [address] was now available if patient could arrive tonight, room 915.  RN spoke with Trauma CM, printed appropriate documents, got Discharge order from on-call Surgery MD, and arranged transport with [facility].  Patient was sent with two working IVs, a foley, and clamped wound vac tubing.  Patient left wearing an Aspen collar and a boot on his R leg.  RN wrapped wound vac tubing and placed it inside his boot for transport.  Calls were placed to patient's sister and mother with no response, patient said he was going to try and call as well.  Report was given to RN on [address], who ordered a wound vac for patient during conversation.  Patient left with his belongings, [facility]

## 2016-08-27 ENCOUNTER — Encounter (HOSPITAL_COMMUNITY): Payer: Self-pay

## 2016-09-15 ENCOUNTER — Telehealth (HOSPITAL_COMMUNITY): Payer: Self-pay

## 2016-09-15 NOTE — Telephone Encounter (Signed)
Scheduled the patient for an appointment in the trauma clinic tomorrow at 2:30 PM. Spoke with patients mother who agrees that they can come to the appointment and will arrive by 2:00PM. She requests that she be contacted if they can be seen earlier in the day.

## 2016-09-16 ENCOUNTER — Telehealth (HOSPITAL_COMMUNITY): Payer: Self-pay

## 2016-09-16 NOTE — Telephone Encounter (Signed)
Spoke with mom who needed to move appt to next week. He still has abd staples that need to come out and I offered a RN visit but he also is having some abd pain she wanted addressed. He also still has sutures in his ear and burning with urination. I gave her names and numbers of Drs. Thimmappa and Manny to f/u for those things.

## 2016-10-05 ENCOUNTER — Inpatient Hospital Stay (INDEPENDENT_AMBULATORY_CARE_PROVIDER_SITE_OTHER): Payer: Self-pay | Admitting: Orthopaedic Surgery

## 2016-10-09 ENCOUNTER — Inpatient Hospital Stay (INDEPENDENT_AMBULATORY_CARE_PROVIDER_SITE_OTHER): Payer: Self-pay | Admitting: Orthopaedic Surgery

## 2016-11-24 ENCOUNTER — Ambulatory Visit (INDEPENDENT_AMBULATORY_CARE_PROVIDER_SITE_OTHER): Payer: Self-pay | Admitting: Orthopaedic Surgery

## 2017-10-09 ENCOUNTER — Encounter (HOSPITAL_COMMUNITY): Payer: Self-pay | Admitting: *Deleted

## 2017-10-09 ENCOUNTER — Emergency Department (HOSPITAL_COMMUNITY)
Admission: EM | Admit: 2017-10-09 | Discharge: 2017-10-13 | Disposition: A | Discharge status: Other Acute Inpatient Hospital | Payer: Self-pay | Attending: Emergency Medicine | Admitting: Emergency Medicine

## 2017-10-09 DIAGNOSIS — F29 Unspecified psychosis not due to a substance or known physiological condition: Secondary | ICD-10-CM | POA: Insufficient documentation

## 2017-10-09 DIAGNOSIS — R4689 Other symptoms and signs involving appearance and behavior: Secondary | ICD-10-CM | POA: Insufficient documentation

## 2017-10-09 DIAGNOSIS — F329 Major depressive disorder, single episode, unspecified: Secondary | ICD-10-CM | POA: Insufficient documentation

## 2017-10-09 DIAGNOSIS — F32A Depression, unspecified: Secondary | ICD-10-CM | POA: Diagnosis present

## 2017-10-09 DIAGNOSIS — R45851 Suicidal ideations: Secondary | ICD-10-CM | POA: Insufficient documentation

## 2017-10-09 DIAGNOSIS — R000 Tachycardia, unspecified: Secondary | ICD-10-CM | POA: Insufficient documentation

## 2017-10-09 DIAGNOSIS — F323 Major depressive disorder, single episode, severe with psychotic features: Secondary | ICD-10-CM | POA: Diagnosis present

## 2017-10-09 LAB — CBC WITH DIFFERENTIAL/PLATELET
BASOS ABS: 0 10*3/uL (ref 0.0–0.1)
BASOS PCT: 1 %
Eosinophils Absolute: 0.7 10*3/uL (ref 0.0–0.7)
Eosinophils Relative: 9 %
HEMATOCRIT: 43.9 % (ref 39.0–52.0)
HEMOGLOBIN: 15.4 g/dL (ref 13.0–17.0)
Lymphocytes Relative: 28 %
Lymphs Abs: 2.2 10*3/uL (ref 0.7–4.0)
MCH: 29.3 pg (ref 26.0–34.0)
MCHC: 35.1 g/dL (ref 30.0–36.0)
MCV: 83.6 fL (ref 78.0–100.0)
Monocytes Absolute: 0.7 10*3/uL (ref 0.1–1.0)
Monocytes Relative: 8 %
NEUTROS ABS: 4.3 10*3/uL (ref 1.7–7.7)
NEUTROS PCT: 54 %
Platelets: 307 10*3/uL (ref 150–400)
RBC: 5.25 MIL/uL (ref 4.22–5.81)
RDW: 12.1 % (ref 11.5–15.5)
WBC: 8 10*3/uL (ref 4.0–10.5)

## 2017-10-09 LAB — ETHANOL: ALCOHOL ETHYL (B): 135 mg/dL — AB (ref <10)

## 2017-10-09 LAB — RAPID URINE DRUG SCREEN, HOSP PERFORMED
Amphetamines: NOT DETECTED
Barbiturates: NOT DETECTED
Benzodiazepines: NOT DETECTED
Cocaine: NOT DETECTED
OPIATES: NOT DETECTED
Tetrahydrocannabinol: NOT DETECTED

## 2017-10-09 LAB — BASIC METABOLIC PANEL
ANION GAP: 9 (ref 5–15)
BUN: 10 mg/dL (ref 6–20)
CHLORIDE: 108 mmol/L (ref 101–111)
CO2: 27 mmol/L (ref 22–32)
Calcium: 9 mg/dL (ref 8.9–10.3)
Creatinine, Ser: 0.95 mg/dL (ref 0.61–1.24)
GFR calc non Af Amer: >60 mL/min (ref >60)
Glucose, Bld: 97 mg/dL (ref 65–99)
Potassium: 3.8 mmol/L (ref 3.5–5.1)
Sodium: 144 mmol/L (ref 135–145)

## 2017-10-09 MED ORDER — ZOLPIDEM TARTRATE 5 MG PO TABS: 5.0000 mg | ORAL_TABLET | Freq: Every evening | ORAL | Status: DC | PRN

## 2017-10-09 MED ORDER — STERILE WATER FOR INJECTION IJ SOLN
INTRAMUSCULAR | Status: AC
  Administered 2017-10-09: 10 mL
  Filled 2017-10-09: qty 10

## 2017-10-09 MED ORDER — IBUPROFEN 200 MG PO TABS: 600.0000 mg | ORAL_TABLET | Freq: Three times a day (TID) | ORAL | Status: DC | PRN

## 2017-10-09 MED ORDER — ONDANSETRON HCL 4 MG PO TABS: 4.0000 mg | ORAL_TABLET | Freq: Three times a day (TID) | ORAL | Status: DC | PRN

## 2017-10-09 MED ORDER — ALUM & MAG HYDROXIDE-SIMETH 200-200-20 MG/5ML PO SUSP: 30.0000 mL | Freq: Four times a day (QID) | ORAL | Status: DC | PRN

## 2017-10-09 MED ORDER — ZIPRASIDONE MESYLATE 20 MG IM SOLR
10.0000 mg | Freq: Once | INTRAMUSCULAR | Status: AC
  Administered 2017-10-09: 10 mg via INTRAMUSCULAR
  Filled 2017-10-09: qty 20
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
## 2017-10-09 NOTE — ED Notes (Signed)
Bed: WA30 Expected date:  Expected time:  Means of arrival:  Comments: 

## 2017-10-09 NOTE — ED Notes (Signed)
Per [facility]C paperwork[facility] but refuses to [facility].  During assessment, pt is reacting to internal stimuli, holding a conversation.

## 2017-10-09 NOTE — BH Assessment (Signed)
[facility] Assessment Progress Note  TTS consulted with David SievertSpencer Simon, PA who recommends inpt treatment. [name], David [name], David [name], David RotaElaine [name], David [name] informed of disposition. TTS to seek placement. Pt's mother requests to be contacted regarding plan of care for the pt. Pt's mother informed pt would have to provide consent prior to releasing information to her regarding his care while in the ED. Mom verbalized understanding.   David [name] -- [phone]  David [name], MSW, LCSW Therapeutic Triage Specialist  [phone]

## 2017-10-09 NOTE — ED Notes (Signed)
TTS assessment in progress. 
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
## 2017-10-09 NOTE — ED Notes (Signed)
Pt. In burgundy scrubs. Pt. And belongings searched and wanded by security. Pt. Has 1 belongings bag locked up in [address]. Pt. Has 1 pr. Neeb shoes, 1 blue sweat jacket, and 1 black [place] pant with hole in the front. Pt. Has no cell phone and no wallet.
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
## 2017-10-09 NOTE — ED Notes (Signed)
[place] [employer] remains with pt.

## 2017-10-10 DIAGNOSIS — F29 Unspecified psychosis not due to a substance or known physiological condition: Secondary | ICD-10-CM

## 2017-10-10 DIAGNOSIS — F22 Delusional disorders: Secondary | ICD-10-CM

## 2017-10-10 DIAGNOSIS — R45851 Suicidal ideations: Secondary | ICD-10-CM

## 2017-10-10 DIAGNOSIS — R45850 Homicidal ideations: Secondary | ICD-10-CM

## 2017-10-10 DIAGNOSIS — R4587 Impulsiveness: Secondary | ICD-10-CM

## 2017-10-10 DIAGNOSIS — F39 Unspecified mood [affective] disorder: Secondary | ICD-10-CM

## 2017-10-10 DIAGNOSIS — F32A Depression, unspecified: Secondary | ICD-10-CM | POA: Diagnosis present

## 2017-10-10 DIAGNOSIS — F323 Major depressive disorder, single episode, severe with psychotic features: Secondary | ICD-10-CM

## 2017-10-10 MED ORDER — CARBAMAZEPINE ER 200 MG PO TB12
200.0000 mg | ORAL_TABLET | Freq: Two times a day (BID) | ORAL | Status: DC
  Administered 2017-10-10 – 2017-10-13 (×7): 200 mg via ORAL
  Filled 2017-10-10 (×7): qty 1

## 2017-10-10 MED ORDER — HALOPERIDOL 2 MG PO TABS
2.0000 mg | ORAL_TABLET | Freq: Two times a day (BID) | ORAL | Status: DC
  Administered 2017-10-10 – 2017-10-13 (×7): 2 mg via ORAL
  Filled 2017-10-10 (×7): qty 1

## 2017-10-10 MED ORDER — BENZTROPINE MESYLATE 0.5 MG PO TABS
0.5000 mg | ORAL_TABLET | Freq: Every day | ORAL | Status: DC
  Administered 2017-10-10 – 2017-10-13 (×4): 0.5 mg via ORAL
  Filled 2017-10-10 (×4): qty 1

## 2017-10-10 NOTE — ED Notes (Signed)
Patient escorted from TCU.  He is not oriented to time or place but can name the president.

## 2017-10-10 NOTE — Progress Notes (Signed)
Psychiatrist and NP state pt appropriate for inpatient treatment.  CS[facility], [place], [address], [place].  Please reconsult if future social work needs arise.  CSW signing off, as social work intervention is no longer needed.  Dorothe PeaJonathan F. Mar Walmer, LCSW, LCAS, CSI Clinical Social Worker Ph: [phone]

## 2017-10-10 NOTE — ED Notes (Signed)
Patient refusing VS.
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
## 2017-10-10 NOTE — ED Notes (Signed)
Patient seen resting 

## 2017-10-10 NOTE — ED Notes (Signed)
Pt stated "Can I call my mama?"  Pt informed calls may be made between 0900 and 2100.  Pt verbalized understanding.

## 2017-10-10 NOTE — ED Notes (Signed)
Pt gave verbal consent to update mother about progress. This RN spoke mother. Pt is agitated, pacing room floor, stating "When can I go home?"

## 2017-10-10 NOTE — ED Notes (Signed)
Pt stating "When can I leave?"  Informed when cleared by MD.  Pt requested another pillow.  Add'l pillow provided.

## 2017-10-10 NOTE — ED Notes (Signed)
Pt refuses to sign authorization disclosure form at this time.

## 2017-10-11 DIAGNOSIS — R454 Irritability and anger: Secondary | ICD-10-CM

## 2017-10-11 MED ORDER — TRAZODONE HCL 50 MG PO TABS
50.0000 mg | ORAL_TABLET | Freq: Every evening | ORAL | Status: DC | PRN
  Administered 2017-10-11 – 2017-10-12 (×2): 50 mg via ORAL
  Filled 2017-10-11 (×2): qty 1

## 2017-10-11 NOTE — ED Notes (Signed)
Pt sleeping at present, awake, alert & responsive, no distress noted, calm & cooperative.  Monitoring for safety, Q 15 min checks in effect.

## 2017-10-11 NOTE — Progress Notes (Signed)
10/11/17 1411:  LRT went to pt room to offer activities, pt was sleep.   Caroll RancherMarjette Latysha Thackston, LRT/CTRS
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
## 2017-10-11 NOTE — ED Notes (Signed)
Pt has been sleeping most of the day.  He is pleasant when awake and took his medication without difficulty.  Pt denies S/I, H/I, and AVH.  15 minute checks and video monitoring in place.
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
## 2017-10-12 NOTE — Progress Notes (Signed)
10/12/17 1350:  LRT introduced self to pt and offered activities, pt declined.    Caroll RancherMarjette Anabelle Bungert, LRT/CTRS

## 2017-10-12 NOTE — ED Notes (Signed)
Pt woke up and ate breakfast this morning. Pt is asked about leaving and says that his mother goes to work at three in the afternoon. He denies si and hi thoughts at this time. Pt is waiting to see MD this morning. Safety maintained on the unit.

## 2017-10-12 NOTE — BH Assessment (Signed)
[facility]  Per Juanetta BeetsJacqueline Norman, DO, this pt continues to require psychiatric hospitalization at this time.  The following facilities have been contacted to seek placement for this pt, with results as noted:  Beds available, information sent, decision pending:  [facility]   Declined:  [place]   At [facility]:  [facility]   Doylene Canninghomas Maeven Mcdougall, [place] Triage Specialist [phone]

## 2017-10-12 NOTE — ED Notes (Signed)
Pt A&O x 3, no distress noted, watching TV at present.  Monitoring for safety, Q 15 min checks in effect. 

## 2017-10-13 ENCOUNTER — Encounter (HOSPITAL_COMMUNITY): Payer: Self-pay

## 2017-10-13 ENCOUNTER — Inpatient Hospital Stay (HOSPITAL_COMMUNITY)
Admission: AD | Admit: 2017-10-13 | Discharge: 2017-10-18 | DRG: 885 | Disposition: A | Discharge status: Home / Self Care | Payer: Federal, State, Local not specified - Other | Attending: Psychiatry | Admitting: Psychiatry

## 2017-10-13 DIAGNOSIS — F419 Anxiety disorder, unspecified: Secondary | ICD-10-CM

## 2017-10-13 DIAGNOSIS — F3110 Bipolar disorder, current episode manic without psychotic features, unspecified: Secondary | ICD-10-CM | POA: Diagnosis not present

## 2017-10-13 DIAGNOSIS — R454 Irritability and anger: Secondary | ICD-10-CM | POA: Diagnosis not present

## 2017-10-13 DIAGNOSIS — F17210 Nicotine dependence, cigarettes, uncomplicated: Secondary | ICD-10-CM | POA: Diagnosis present

## 2017-10-13 DIAGNOSIS — F29 Unspecified psychosis not due to a substance or known physiological condition: Secondary | ICD-10-CM | POA: Diagnosis not present

## 2017-10-13 DIAGNOSIS — G4700 Insomnia, unspecified: Secondary | ICD-10-CM

## 2017-10-13 DIAGNOSIS — R4587 Impulsiveness: Secondary | ICD-10-CM | POA: Diagnosis not present

## 2017-10-13 DIAGNOSIS — F312 Bipolar disorder, current episode manic severe with psychotic features: Secondary | ICD-10-CM | POA: Diagnosis present

## 2017-10-13 DIAGNOSIS — R450 Nervousness: Secondary | ICD-10-CM

## 2017-10-13 MED ORDER — CARBAMAZEPINE ER 200 MG PO TB12
200.0000 mg | ORAL_TABLET | Freq: Two times a day (BID) | ORAL | Status: DC
  Administered 2017-10-13 – 2017-10-14 (×2): 200 mg via ORAL
  Filled 2017-10-13 (×7): qty 1

## 2017-10-13 MED ORDER — ONDANSETRON HCL 4 MG PO TABS: 4.0000 mg | ORAL_TABLET | Freq: Three times a day (TID) | ORAL | Status: DC | PRN

## 2017-10-13 MED ORDER — HALOPERIDOL 2 MG PO TABS
2.0000 mg | ORAL_TABLET | Freq: Two times a day (BID) | ORAL | Status: DC
  Administered 2017-10-13 – 2017-10-16 (×6): 2 mg via ORAL
  Filled 2017-10-13 (×11): qty 1

## 2017-10-13 MED ORDER — MAGNESIUM HYDROXIDE 400 MG/5ML PO SUSP: 30.0000 mL | Freq: Every day | ORAL | Status: DC | PRN

## 2017-10-13 MED ORDER — TRAZODONE HCL 50 MG PO TABS
50.0000 mg | ORAL_TABLET | Freq: Every evening | ORAL | Status: DC | PRN
Start: 2017-10-13 — End: 2017-10-18
  Administered 2017-10-13 – 2017-10-17 (×8): 50 mg via ORAL
  Filled 2017-10-13 (×4): qty 1
  Filled 2017-10-13: qty 2
  Filled 2017-10-13 (×2): qty 1

## 2017-10-13 MED ORDER — BENZTROPINE MESYLATE 0.5 MG PO TABS
0.5000 mg | ORAL_TABLET | Freq: Every day | ORAL | Status: DC
  Administered 2017-10-14 – 2017-10-18 (×5): 0.5 mg via ORAL
  Filled 2017-10-13 (×4): qty 1
  Filled 2017-10-13: qty 7
  Filled 2017-10-13 (×3): qty 1

## 2017-10-13 MED ORDER — ALUM & MAG HYDROXIDE-SIMETH 200-200-20 MG/5ML PO SUSP: 30.0000 mL | ORAL | Status: DC | PRN

## 2017-10-13 MED ORDER — IBUPROFEN 600 MG PO TABS: 600.0000 mg | ORAL_TABLET | Freq: Three times a day (TID) | ORAL | Status: DC | PRN

## 2017-10-13 MED ORDER — ACETAMINOPHEN 325 MG PO TABS
650.0000 mg | ORAL_TABLET | Freq: Four times a day (QID) | ORAL | Status: DC | PRN
  Administered 2017-10-15 – 2017-10-17 (×4): 650 mg via ORAL
  Filled 2017-10-13 (×3): qty 2
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
## 2017-10-13 NOTE — ED Notes (Signed)
Pt transported to [facility] by [facility]. All belongings returned to pt who signed for same. Pt was calm and cooperative.  

## 2017-10-13 NOTE — Tx Team (Signed)
Initial Treatment Plan 10/13/2017 7:08 PM David [name] [place] [id number]:[id number]    PATIENT STRESSORS: Financial difficulties Marital or family conflict Medication change or noncompliance   PATIENT STRENGTHS: General fund of knowledge Motivation for treatment/growth Physical Health   PATIENT IDENTIFIED PROBLEMS: Psychosis  Agitation  "Not to have to come back to the hospital"  "Stay focused on me and not others"               DISCHARGE CRITERIA:  Improved stabilization in mood, thinking, and/or behavior Verbal commitment to aftercare and medication compliance  PRELIMINARY DISCHARGE PLAN: Outpatient therapy Medication management  PATIENT/FAMILY INVOLVEMENT: This treatment plan has been presented to and reviewed with the patient, David [name] Tener.  The patient and family have been given the opportunity to ask questions and make suggestions.  Levin BaconHeather V Fermina Mishkin, RN 10/13/2017, 7:08 PM

## 2017-10-13 NOTE — BH Assessment (Signed)
[facility] Progress [facility]  Per Juanetta BeetsJacqueline Norman, DO, this pt requires psychiatric hospitalization.  Malva LimesLinsey Strader, RN, [facility]; [facility] are ready [facility].  Pt presents under IVC initiated by pt's mother, and upheld by Dr Sharma CovertNorman, and [facility].  Pt's nurse, Diane, has been notified, and agrees to call report to [phone].  Pt is to be transported via [employer] when the time comes.   Doylene Canninghomas Dajana Gehrig, MA Triage Specialist [phone]

## 2017-10-13 NOTE — Progress Notes (Signed)
D   Pt is calm and cooperative   He took his medications without a problem   He was visible on the milieu but had limited interaction   He did attend group this evening  A   Verbal support given   Medications administered and effectiveness monitored   Q 15 min checks R  Pt is safe at present
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
## 2017-10-13 NOTE — ED Notes (Signed)
Attempted to call report: Nurses not available.

## 2017-10-13 NOTE — Progress Notes (Signed)
Date:  10/13/2017 Time:  8:42 PM  Group Topic/Focus:  Wrap-Up Group:   The focus of this group is to help patients review their daily goal of treatment and discuss progress on daily workbooks.  Participation Level:  Active  Participation Quality:  Appropriate  Affect:  Appropriate  Cognitive:  Appropriate  Insight: Appropriate  Engagement in Group:  Engaged  Modes of Intervention:  Discussion  Additional Comments:  The patient expressed that he wanted to go as soon as possible. His goal was stay possible.   Riley LamBarker, Theus Espin 10/13/2017, 8:42 PM

## 2017-10-14 DIAGNOSIS — R443 Hallucinations, unspecified: Secondary | ICD-10-CM

## 2017-10-14 DIAGNOSIS — F3110 Bipolar disorder, current episode manic without psychotic features, unspecified: Secondary | ICD-10-CM

## 2017-10-14 DIAGNOSIS — R4586 Emotional lability: Secondary | ICD-10-CM

## 2017-10-14 DIAGNOSIS — R4587 Impulsiveness: Secondary | ICD-10-CM

## 2017-10-14 DIAGNOSIS — F17210 Nicotine dependence, cigarettes, uncomplicated: Secondary | ICD-10-CM

## 2017-10-14 DIAGNOSIS — F29 Unspecified psychosis not due to a substance or known physiological condition: Secondary | ICD-10-CM

## 2017-10-14 DIAGNOSIS — R454 Irritability and anger: Secondary | ICD-10-CM

## 2017-10-14 DIAGNOSIS — F1910 Other psychoactive substance abuse, uncomplicated: Secondary | ICD-10-CM

## 2017-10-14 DIAGNOSIS — G4700 Insomnia, unspecified: Secondary | ICD-10-CM

## 2017-10-14 MED ORDER — HYDROXYZINE HCL 50 MG PO TABS
50.0000 mg | ORAL_TABLET | Freq: Once | ORAL | Status: AC
  Administered 2017-10-14: 50 mg via ORAL
  Filled 2017-10-14 (×2): qty 1

## 2017-10-14 MED ORDER — DIVALPROEX SODIUM ER 500 MG PO TB24
500.0000 mg | ORAL_TABLET | Freq: Every day | ORAL | Status: DC
  Administered 2017-10-14 – 2017-10-17 (×4): 500 mg via ORAL
  Filled 2017-10-14: qty 7
  Filled 2017-10-14 (×5): qty 1

## 2017-10-14 NOTE — Progress Notes (Signed)
[facility] Group Notes:  (Nursing/MHT/Case Management/Adjunct)  Date:  10/14/2017  Time:  9:35 PM  Type of Therapy:  Psychoeducational Skills  Participation Level:  Minimal  Participation Quality:  Attentive  Affect:  Appropriate  Cognitive:  Lacking  Insight:  Limited  Engagement in Group:  Developing/Improving  Modes of Intervention:  Education  Summary of Progress/Problems: Patient stated his day was "boring" and that he feels that he is "just here" and is not learning anything in the hospital. When asked about what he wished to learn or focus on while in the hospital, he stated that he wanted to slow down on his drinking. He states that he is not a nice person when he is intoxicated.   Hazle CocaGOODMAN, Saiya Crist S 10/14/2017, 9:35 PM

## 2017-10-14 NOTE — Progress Notes (Signed)
DAR NOTE: Patient presents with calm affect and pleasant mood.  Denies pain, auditory and visual hallucinations.  Described energy level as normal and concentration as good.  Rates depression at 0, hopelessness at 0 and anxiety at 3.  Maintained on routine safety checks.  Medications given as prescribed.  Support and encouragement offered as needed.  Attended group and participated.  States goal for today is "when I'm going home."  Patient is withdrawn and isolates.  Minimal interaction with staff and peers.
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
## 2017-10-14 NOTE — Plan of Care (Signed)
Problem: Safety: Goal: Periods of time without injury will increase Outcome: Progressing Patient is safe and free from injury.  Routine safety checks maintained every 15 minutes.   
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
## 2017-10-14 NOTE — Tx Team (Signed)
Interdisciplinary Treatment and Diagnostic Plan Update  10/14/2017 Time of Session: 10:18 AM  David [name] MRN: [id number]  Principal Diagnosis: SCHIZOPHRENIA DISORDER  Secondary Diagnoses: Active Problems:   Psychosis (HCC)   Current Medications:  Current Facility-Administered Medications  Medication Dose Route Frequency Provider Last Rate Last Dose  . acetaminophen (TYLENOL) tablet 650 mg  650 mg Oral Q6H PRN Laveda AbbeParks, Laurie Britton, NP      . alum & mag hydroxide-simeth (MAALOX/MYLANTA) 200-200-20 MG/5ML suspension 30 mL  30 mL Oral Q4H PRN Laveda AbbeParks, Laurie Britton, NP      . benztropine (COGENTIN) tablet 0.5 mg  0.5 mg Oral Daily Laveda AbbeParks, Laurie Britton, NP   0.5 mg at 10/14/17 [id number]  . carbamazepine (TEGRETOL XR) 12 hr tablet 200 mg  200 mg Oral BID Laveda AbbeParks, Laurie Britton, NP   200 mg at 10/14/17 [id number]  . haloperidol (HALDOL) tablet 2 mg  2 mg Oral BID Laveda AbbeParks, Laurie Britton, NP   2 mg at 10/14/17 [id number]  . ibuprofen (ADVIL,MOTRIN) tablet 600 mg  600 mg Oral Q8H PRN Laveda AbbeParks, Laurie Britton, NP      . magnesium hydroxide (MILK OF MAGNESIA) suspension 30 mL  30 mL Oral Daily PRN Laveda AbbeParks, Laurie Britton, NP      . ondansetron [facility](ZOFRAN) tablet 4 mg  4 mg Oral Q8H PRN Laveda AbbeParks, Laurie Britton, NP      . traZODone (DESYREL) tablet 50 mg  50 mg Oral QHS PRN,MR X 1 Laveda AbbeParks, Laurie Britton, NP   50 mg at 10/13/17 2057    PTA Medications: No prescriptions prior to admission.    Treatment Modalities: Medication Management, Group therapy, Case management,  1 to 1 session with clinician, Psychoeducation, Recreational therapy.  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  Therapeutic Interventions: 1 to 1 sessions, Unit Group sessions and Medication administration.  Evaluation of Outcomes: Progressing  Physician Treatment Plan for Secondary Diagnosis: Active Problems:   Psychosis (HCC)  Long Term Goal(s): Improvement in symptoms so as ready for discharge  Short Term Goals:    Medication Management: Evaluate patient's response, side effects, and tolerance of medication regimen.  Therapeutic Interventions: 1 to 1 sessions, Unit Group sessions and Medication administration.  Evaluation of Outcomes: Progressing   RN Treatment Plan for Primary Diagnosis: SCHIZOPHRENIA DISORDER Long Term Goal(s): Knowledge of disease and therapeutic regimen to maintain health will improve  Short Term Goals: Ability to participate in decision making will improve, Ability to verbalize feelings will improve, Ability to identify and develop effective coping behaviors will improve and Compliance with prescribed medications will improve  Medication Management: RN will administer medications as ordered by provider, will assess and evaluate patient's response and provide education to patient for prescribed medication. RN will report any adverse and/or side effects to prescribing provider.  Therapeutic Interventions: 1 on 1 counseling sessions, Psychoeducation, Medication administration, Evaluate responses to treatment, Monitor vital signs and CBGs as ordered, Perform/monitor CIWA, COWS, AIMS and Fall Risk screenings as ordered, Perform wound care treatments as ordered.  Evaluation of Outcomes: Progressing   LCSW Treatment Plan for Primary Diagnosis: SCHIZOPHRENIA DISORDER Long Term Goal(s): Safe transition to appropriate next level of care at discharge,  Engage patient in therapeutic group addressing interpersonal concerns.  Short Term Goals: Engage patient in aftercare planning with  referrals and resources, Facilitate acceptance of mental health diagnosis and concerns, Identify triggers associated with mental health/substance abuse issues and Increase skills for wellness and recovery  Therapeutic Interventions: Assess for all discharge needs, 1 to 1 time with Social worker, Explore available resources and support systems, Assess for adequacy in community support network, Educate family and significant other(s) on suicide prevention, Complete Psychosocial Assessment, Interpersonal group therapy.  Evaluation of Outcomes: Progressing   Progress in Treatment: Attending groups: No Participating in groups: No Taking medication as prescribed: Yes Toleration of medication: Yes, no side effects reported at this time Family/Significant other contact made: Anthonette Legato [phone] Patient understands diagnosis: No, limited insight  Discussing patient identified problems/goals with staff: Yes Medical problems stabilized or resolved: Yes Denies suicidal/homicidal ideation: Yes Issues/concerns per patient self-inventory: None Other: N/A  New problem(s) identified: None identified at this time.   New Short Term/Long Term Goal(s): Pt states that "There is nothing you can do for me that I can't do for myself".   Discharge Plan or Barriers: Upon discharge he will return home with his mother to their trailer at [facility]   Reason for Continuation of Hospitalization: Hallucinations Medication stabilization   Estimated Length of Stay: 10/19/17  Attendees: Patient: David [name]  10/14/2017  10:18 AM  Physician: Jolyne Loa , MD 10/14/2017  10:18 AM  Nursing: Estella Husk, RN 10/14/2017  10:18 AM  RN Care Manager: Onnie Boer, RN 10/14/2017  10:18 AM  Social Worker: Richelle Ito, LCSW; Melba Coon, Social Work Intern 10/14/2017  10:18 AM  Recreational Therapist: [name], LRT 10/14/2017  10:18 AM  Other: Tomasita Morrow, P4CC 10/14/2017  10:18 AM   Other:  10/14/2017  10:18 AM  Other: 10/14/2017  10:18 AM    Scribe for Treatment Team: Aram Beecham, Student-Social Work 10/14/2017 10:18 AM

## 2017-10-14 NOTE — [facility] Group Notes (Signed)
LCSW Group Therapy Note   10/14/2017 1:15pm   Type of Therapy and Topic:  Group Therapy:  Positive Affirmations   Participation Level:  Minimal  Description of Group: This group addressed positive affirmation toward self and others. Patients went around the room and identified two positive things about themselves and two positive things about a peer in the room. Patients reflected on how it felt to share something positive with others, to identify positive things about themselves, and to hear positive things from others. Patients were encouraged to have a daily reflection of positive characteristics or circumstances.  Therapeutic Goals 1. Patient will verbalize two of their positive qualities 2. Patient will demonstrate empathy for others by stating two positive qualities about a peer in the group 3. Patient will verbalize their feelings when voicing positive self affirmations and when voicing positive affirmations of others 4. Patients will discuss the potential positive impact on their wellness/recovery of focusing on positive traits of self and others. Summary of Patient Progress:  David [name] came to group late but he remained in the group until the doctor pulled him out of the room.  He came back at the end of group and shared that he is trying to do everything right so he can get out of the hospital.   Therapeutic Modalities Cognitive Behavioral Therapy Motivational Interviewing  David [name], Student-Social Work 10/14/2017 1:12 PM
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
## 2017-10-14 NOTE — Progress Notes (Signed)
D   Pt is calm and cooperative   He took his medications without a problem   He was visible on the milieu but had limited interaction   He did attend group this evening   Pt was having difficulty falling asleep this evening and needed extra medications to help him fall asleep A   Verbal support given   Medications administered and effectiveness monitored   Q 15 min checks R  Pt is safe at present
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
## 2017-10-15 LAB — TSH: TSH: 2.052 u[IU]/mL (ref 0.350–4.500)

## 2017-10-15 LAB — HEMOGLOBIN A1C
Hgb A1c MFr Bld: 5 % (ref 4.8–5.6)
MEAN PLASMA GLUCOSE: 96.8 mg/dL

## 2017-10-15 NOTE — Plan of Care (Signed)
Problem: Safety: Goal: Periods of time without injury will increase Outcome: Progressing Patient is safe and free from injury.  Routine safety checks maintained every 15 minutes.   

## 2017-10-15 NOTE — Progress Notes (Signed)
Patient has been up and active on the unit, attended group this evening and has voiced no complaints.  He has been interacting with David BerryQuentel M. Mostly tonight. Writer has observed him laugh inappropriately at times during our conversation. Patient currently denies si/hi/a/v hallucinations. Support and encouragement offered, safety maintained on unit, will continue to monitor.
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
## 2017-10-15 NOTE — Progress Notes (Signed)
DAR NOTE: Patient presents with anxious affect and mood.  Denies auditory and visual hallucinations.  Described energy level as normal and concentration as good.  Rates depression at 0, hopelessness at 0, and anxiety at 0.  Maintained on routine safety checks.  Medications given as prescribed.  Support and encouragement offered as needed.  Attended group and participated. Patient observed socializing with peers in the dayroom.

## 2017-10-15 NOTE — Progress Notes (Signed)
[facility] MD Progress Note  10/15/2017 3:48 PM David [name]  MRN:  [id number] Subjective:   David [name] is a 21 y/o M with history of Bipolar disorder who was admitted with worsening agitation and aggression towards his family. He was started on depakote to address his symptoms. Today, he reports that he is doing well overall. He states, "I'm doing OK today." He has been sleeping well and his appetite is adequate. He denies SI/HI/AH/VH. He is tolerating his current medication regimen without difficulty or side effects. He has not been in contact with his family. He has been participating in groups and the therapeutic milieu. Discussed with patient that we will plan to continue his current treatment regimen over the weekend and then plan to obtain a depakote level to assure he is at a therapeutic dose. Pt verbalized good understanding. He had no further questions, comments, or concerns.  Principal Problem: Bipolar I disorder, most recent episode (or current) manic (HCC) Diagnosis:   Patient Active Problem List   Diagnosis Date Noted  . Bipolar I disorder, most recent episode (or current) manic (HCC) [F31.10] 10/14/2017  . Psychosis (HCC) [F29] 10/13/2017  . 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 Avulsion of left ear [S01.302A] 08/24/2016  . Multiple fractures of cervical spine (HCC) [S12.9XXA] 08/24/2016  . Mesenteric hematoma [S36.892A] 08/24/2016  . Serosal tear of colon [S36.539A] 08/24/2016  . Multiple abrasions [T07.XXXA] 08/24/2016  . Open fracture of right tibia and fibula [S82.201B, S82.401B] 08/24/2016  . Multiple closed tarsal fractures of left foot [S92.202A] 08/24/2016  . Acute blood loss anemia [D62] 08/24/2016  . Acute urinary retention [R33.8] 08/24/2016  . Blunt trauma [T14.90XA] 08/22/2016  . Aggressive behavior [R46.89]    Total Time spent with patient: 30 minutes  Past Psychiatric History: see H&P  Past  Medical History: History reviewed. No pertinent past medical history.  Past Surgical History:  Procedure Laterality Date  . APPLICATION OF WOUND VAC Right 08/23/2016   Procedure: WOUND VAC EXCHANGE;  Surgeon: Tarry Kos, MD;  Location: [facility] OR;  Service: Orthopedics;  Laterality: Right;  . EXTERNAL FIXATION LEG  08/22/2016   Procedure: POSSIBLE EXTERNAL FIXATION LEG;  Surgeon: Gaynelle Adu, MD;  Location: [facility] OR;  Service: General;;  . EXTERNAL FIXATION REMOVAL Right 08/23/2016   Procedure: REMOVAL EXTERNAL FIXATION LEG;  Surgeon: Tarry Kos, MD;  Location: [facility] OR;  Service: Orthopedics;  Laterality: Right;  . I&D EXTREMITY Right 08/22/2016   Procedure: IRRIGATION AND DEBRIDEMENT RIGHT LEG AND CASTING;  Surgeon: Gaynelle Adu, MD;  Location: [facility] OR;  Service: General;  Laterality: Right;  . LAPAROSCOPY N/A 08/22/2016   Procedure: LAPAROSCOPY DIAGNOSTIC;  Surgeon: Gaynelle Adu, MD;  Location: [facility] OR;  Service: General;  Laterality: N/A;  . LAPAROTOMY N/A 08/22/2016   Procedure: Exploratory LAPAROTOMY repair of right colon serosal tear - pre-op blunt abdominal trauma;  Surgeon: Gaynelle Adu, MD;  Location: [facility] OR;  Service: General;  Laterality: N/A;  . OTOPLASATY Left 08/22/2016   Procedure: REPAIR OF LEFT EAR;  Surgeon: Gaynelle Adu, MD;  Location: [facility] OR;  Service: General;  Laterality: Left;  . TIBIA IM NAIL INSERTION Right 08/23/2016   Procedure: INTRAMEDULLARY (IM) NAIL TIBIAL;  Surgeon: Tarry Kos, MD;  Location: [facility] OR;  Service: Orthopedics;  Laterality: Right;   Family History: History reviewed. No pertinent family history. Family Psychiatric  History: see H&P Social History:  History  Alcohol Use  . Yes  History  Drug Use No    Social History   Social History  . Marital status: Single    Spouse name: N/A  . Number of children: N/A  . Years of education: N/A   Social History Main Topics  . Smoking status: Current Every Day Smoker    Packs/day: 1.00  . Smokeless tobacco: Never Used  .  Alcohol use Yes  . Drug use: No  . Sexual activity: Not Asked   Other Topics Concern  . None   Social History Narrative   ** Merged History Encounter **       Additional Social History:                         Sleep: Good  Appetite:  Good  Current Medications: Current Facility-Administered Medications  Medication Dose Route Frequency Provider Last Rate Last Dose  . acetaminophen (TYLENOL) tablet 650 mg  650 mg Oral Q6H PRN Laveda AbbeParks, Laurie Britton, NP   650 mg at 10/15/17 0809  . alum & mag hydroxide-simeth (MAALOX/MYLANTA) 200-200-20 MG/5ML suspension 30 mL  30 mL Oral Q4H PRN Laveda AbbeParks, Laurie Britton, NP      . benztropine (COGENTIN) tablet 0.5 mg  0.5 mg Oral Daily Laveda AbbeParks, Laurie Britton, NP   0.5 mg at 10/15/17 [id number]  . divalproex (DEPAKOTE ER) 24 hr tablet 500 mg  500 mg Oral QHS Nwoko, Agnes I, NP   500 mg at 10/14/17 2058  . haloperidol (HALDOL) tablet 2 mg  2 mg Oral BID Laveda AbbeParks, Laurie Britton, NP   2 mg at 10/15/17 [id number]  . ibuprofen (ADVIL,MOTRIN) tablet 600 mg  600 mg Oral Q8H PRN Laveda AbbeParks, Laurie Britton, NP      . magnesium hydroxide (MILK OF MAGNESIA) suspension 30 mL  30 mL Oral Daily PRN Laveda AbbeParks, Laurie Britton, NP      . ondansetron [facility](ZOFRAN) tablet 4 mg  4 mg Oral Q8H PRN Laveda AbbeParks, Laurie Britton, NP      . traZODone (DESYREL) tablet 50 mg  50 mg Oral QHS PRN,MR X 1 Laveda AbbeParks, Laurie Britton, NP   50 mg at 10/14/17 2100    Lab Results:  Results for orders placed or performed during the hospital encounter of 10/13/17 (from the past 48 hour(s))  TSH     Status: None   Collection Time: 10/15/17  7:43 AM  Result Value Ref Range   TSH 2.052 0.350 - 4.500 uIU/mL    Comment: Performed by a 3rd Generation assay with a functional sensitivity of <=0.01 uIU/mL. Performed at [facility], [address] [place], [place] [address]   Hemoglobin A1c     Status: None   Collection Time: 10/15/17  7:43 AM  Result Value Ref Range   Hgb A1c MFr Bld 5.0 4.8 - 5.6 %     Comment: (NOTE) Pre diabetes:          5.7%-6.4% Diabetes:              >6.4% Glycemic control for   <7.0% adults with diabetes    Mean Plasma Glucose 96.8 mg/dL    Comment: Performed at [facility], [address] [place], [place] [address]    Blood Alcohol level:  Lab Results  Component Value Date   ETH 135 (H) 10/09/2017   ETH <5 08/21/2016    Metabolic Disorder Labs: Lab Results  Component Value Date   HGBA1C 5.0 10/15/2017   MPG 96.8 10/15/2017   No  results found for: PROLACTIN No results found for: CHOL, TRIG, HDL, CHOLHDL, VLDL, LDLCALC  Physical Findings: AIMS: Facial and Oral Movements Muscles of Facial Expression: None, normal Lips and Perioral Area: None, normal Jaw: None, normal Tongue: None, normal,Extremity Movements Upper (arms, wrists, hands, fingers): None, normal Lower (legs, knees, ankles, toes): None, normal, Trunk Movements Neck, shoulders, hips: None, normal, Overall Severity Severity of abnormal movements (highest score from questions above): None, normal Incapacitation due to abnormal movements: None, normal Patient's awareness of abnormal movements (rate only patient's report): No Awareness, Dental Status Current problems with teeth and/or dentures?: No Does patient usually wear dentures?: No  CIWA:    COWS:     Musculoskeletal: Strength & Muscle Tone: within normal limits Gait & Station: normal Patient leans: N/A  Psychiatric Specialty Exam: Physical Exam  Nursing note and vitals reviewed.   Review of Systems  Constitutional: Negative for chills and fever.  Respiratory: Negative for cough and shortness of breath.   Cardiovascular: Negative for chest pain.  Gastrointestinal: Negative for abdominal pain, heartburn, nausea and vomiting.  Psychiatric/Behavioral: Negative for depression, hallucinations and suicidal ideas. The patient is not nervous/anxious.     Blood pressure 131/75, pulse 85, temperature 98.6 F (37 C),  temperature source Oral, resp. rate 16, height 5' 11.5" (1.816 m), weight 72.6 kg (160 lb), SpO2 100 %.Body mass index is 22 kg/m.  General Appearance: Casual and Fairly Groomed  Eye Contact:  Good  Speech:  Clear and Coherent and Normal Rate  Volume:  Normal  Mood:  Euthymic  Affect:  Congruent and Constricted  Thought Process:  Goal Directed  Orientation:  Full (Time, Place, and Person)  Thought Content:  Logical  Suicidal Thoughts:  No  Homicidal Thoughts:  No  Memory:  Immediate;   Good Recent;   Good Remote;   Good  Judgement:  Fair  Insight:  Fair  Psychomotor Activity:  Normal  Concentration:  Concentration: Good  Recall:  Good  Fund of Knowledge:  Good  Language:  Fair  Akathisia:  No  Handed:    AIMS (if indicated):     Assets:  Communication Skills Desire for Improvement Housing Physical Health Resilience Social Support  ADL's:  Intact  Cognition:  WNL  Sleep:  Number of Hours: 6     Treatment Plan Summary: Daily contact with patient to assess and evaluate symptoms and progress in treatment and Medication management. Pt was admitted with worsening agitation and aggression towards his family. He was started on depakote to address mood and agitation symptoms, which he has tolerated without problems. He has been calm and cooperative on the unit.  - Continue inpatient hospitalization - Bipolar Disorder I             - Continue Depakote ER 500mg  qhs             - Continue haldol 2mg  BID           - Encourage participation in groups and therapeutic milieu - Increase collateral information - Discharge planning will be ongoing  Micheal Likens, MD 10/15/2017, 3:48 PM

## 2017-10-15 NOTE — Progress Notes (Signed)
[facility] Group Notes:  (Nursing/MHT/Case Management/Adjunct)  Date:  10/15/2017  Time:  10:10 PM  Type of Therapy:  Psychoeducational Skills  Participation Level:  Active  Participation Quality:  Appropriate  Affect:  Appropriate  Cognitive:  Appropriate  Insight:  Appropriate  Engagement in Group:  Engaged  Modes of Intervention:  Education  Summary of Progress/Problems: The patient states that he had a slow day and that it was positive day overall. He could not come up with a coping skill.   [name] S 10/15/2017, 10:10 PM

## 2017-10-16 LAB — PROLACTIN: PROLACTIN: 58.5 ng/mL — AB (ref 4.0–15.2)

## 2017-10-16 MED ORDER — HALOPERIDOL 2 MG PO TABS
2.5000 mg | ORAL_TABLET | Freq: Two times a day (BID) | ORAL | Status: DC
  Administered 2017-10-16 – 2017-10-18 (×4): 2.5 mg via ORAL
  Filled 2017-10-16 (×11): qty 1

## 2017-10-16 NOTE — Progress Notes (Signed)
Adult Psychoeducational Group Note  Date:  10/16/2017 Time:  8:42 PM  Group Topic/Focus:  Wrap-Up Group:   The focus of this group is to help patients review their daily goal of treatment and discuss progress on daily workbooks.  Participation Level:  Active  Participation Quality:  Appropriate  Affect:  Appropriate  Cognitive:  Appropriate  Insight: Appropriate  Engagement in Group:  Engaged  Modes of Intervention:  Discussion  Additional Comments:  The patient expressed that he rates today a 8.The patient also said that attending the group was a healthy coping skill.  Octavio Mannshigpen, Shannara Winbush Lee 10/16/2017, 8:42 PM

## 2017-10-16 NOTE — Progress Notes (Signed)
[facility]  10/16/2017 3:12 PM David MustCameron M [name]  MRN:  [id number] Subjective:  David LangCameron reports " I am fine"  Objective: David [name] is awake, alert and oriented. Seen resting in bedroom.  Denies suicidal or homicidal ideation during this assessment.  Denies auditory or visual hallucination and does not appear to be responding to internal stimuli.  Patient interacts well with staff and others. Patient reports he is medication compliant with the Cogentin, Depakote and Haldol without mediation side effects. Patient denies depression or depressive symptoms  Reports good appetite and states  He is resting well.  Support, encouragement and reassurance was provided.    Principal Problem: Bipolar I disorder, most recent episode (or current) manic (HCC) Diagnosis:   Patient Active Problem List   Diagnosis Date Noted  . Bipolar I disorder, most recent episode (or current) manic (HCC) [F31.10] 10/14/2017  . Psychosis (HCC) [F29] 10/13/2017  . 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 Avulsion of left ear [S01.302A] 08/24/2016  . Multiple fractures of cervical spine (HCC) [S12.9XXA] 08/24/2016  . Mesenteric hematoma [S36.892A] 08/24/2016  . Serosal tear of colon [S36.539A] 08/24/2016  . Multiple abrasions [T07.XXXA] 08/24/2016  . Open fracture of right tibia and fibula [S82.201B, S82.401B] 08/24/2016  . Multiple closed tarsal fractures of left foot [S92.202A] 08/24/2016  . Acute blood loss anemia [D62] 08/24/2016  . Acute urinary retention [R33.8] 08/24/2016  . Blunt trauma [T14.90XA] 08/22/2016  . Aggressive behavior [R46.89]    Total Time spent with patient: 30 minutes  Past Psychiatric History: see H&P  Past Medical History: History reviewed. No pertinent past medical history.  Past Surgical History:  Procedure Laterality Date  . APPLICATION OF WOUND VAC Right 08/23/2016   Procedure: WOUND VAC EXCHANGE;  Surgeon:  Tarry KosNaiping M Xu, MD;  Location: [facility] OR;  Service: Orthopedics;  Laterality: Right;  . EXTERNAL FIXATION LEG  08/22/2016   Procedure: POSSIBLE EXTERNAL FIXATION LEG;  Surgeon: Gaynelle AduEric Wilson, MD;  [facility];  Service: General;;  . EXTERNAL FIXATION REMOVAL Right 08/23/2016   Procedure: REMOVAL EXTERNAL FIXATION LEG;  Surgeon: Tarry KosNaiping M Xu, MD;  Location: [facility] OR;  Service: Orthopedics;  Laterality: Right;  . I&D EXTREMITY Right 08/22/2016   Procedure: IRRIGATION AND DEBRIDEMENT RIGHT LEG AND CASTING;  Surgeon: Gaynelle AduEric Wilson, MD;  [facility];  Service: General;  Laterality: Right;  . LAPAROSCOPY N/A 08/22/2016   Procedure: LAPAROSCOPY DIAGNOSTIC;  Surgeon: Gaynelle AduEric Wilson, MD;  [facility];  Service: General;  Laterality: N/A;  . LAPAROTOMY N/A 08/22/2016   Procedure: Exploratory LAPAROTOMY repair of right colon serosal tear - pre-op blunt abdominal trauma;  Surgeon: Gaynelle AduEric Wilson, MD;  [facility];  Service: General;  Laterality: N/A;  . OTOPLASATY Left 08/22/2016   Procedure: REPAIR OF LEFT EAR;  Surgeon: Gaynelle AduEric Wilson, MD;  [facility];  Service: General;  Laterality: Left;  . TIBIA IM NAIL INSERTION Right 08/23/2016   Procedure: INTRAMEDULLARY (IM) NAIL TIBIAL;  Surgeon: Tarry KosNaiping M Xu, MD;  Location: [facility] OR;  Service: Orthopedics;  Laterality: Right;   Family History: History reviewed. No pertinent family history. Family Psychiatric  History: see H&P Social History:  History  Alcohol Use  . Yes     History  Drug Use No    Social History   Social History  . Marital status: Single    Spouse name: N/A  . Number of children: N/A  . Years of education: N/A  Social History Main Topics  . Smoking status: Current Every Day Smoker    Packs/day: 1.00  . Smokeless tobacco: Never Used  . Alcohol use Yes  . Drug use: No  . Sexual activity: Not Asked   Other Topics Concern  . None   Social History Narrative   ** Merged History Encounter **       Additional Social History:                          Sleep: Good  Appetite:  Good  Current Medications: Current Facility-Administered Medications  Medication Dose Route Frequency Provider Last Rate Last Dose  . acetaminophen (TYLENOL) tablet 650 mg  650 mg Oral Q6H PRN Laveda Abbe, NP   650 mg at 10/15/17 2146  . alum & mag hydroxide-simeth (MAALOX/MYLANTA) 200-200-20 MG/5ML suspension 30 mL  30 mL Oral Q4H PRN Laveda Abbe, NP      . benztropine (COGENTIN) tablet 0.5 mg  0.5 mg Oral Daily Laveda Abbe, NP   0.5 mg at 10/16/17 1131  . divalproex (DEPAKOTE ER) 24 hr tablet 500 mg  500 mg Oral QHS Nwoko, Agnes I, NP   500 mg at 10/15/17 2146  . haloperidol (HALDOL) tablet 2 mg  2 mg Oral BID Laveda Abbe, NP   2 mg at 10/16/17 1131  . ibuprofen (ADVIL,MOTRIN) tablet 600 mg  600 mg Oral Q8H PRN Laveda Abbe, NP      . magnesium hydroxide (MILK OF MAGNESIA) suspension 30 mL  30 mL Oral Daily PRN Laveda Abbe, NP      . ondansetron [facility]) tablet 4 mg  4 mg Oral Q8H PRN Laveda Abbe, NP      . traZODone (DESYREL) tablet 50 mg  50 mg Oral QHS PRN,MR X 1 Laveda Abbe, NP   50 mg at 10/15/17 2147    Lab Results:  Results for orders placed or performed during the hospital encounter of 10/13/17 (from the past 48 hour(s))  TSH     Status: None   Collection Time: 10/15/17  7:43 AM  Result Value Ref Range   TSH 2.052 0.350 - 4.500 uIU/mL    Comment: Performed by a 3rd Generation assay with a functional sensitivity of <=0.01 uIU/mL. Performed at [facility], [address], [place], [place] [address]   Hemoglobin A1c     Status: None   Collection Time: 10/15/17  7:43 AM  Result Value Ref Range   Hgb A1c MFr Bld 5.0 4.8 - 5.6 %    Comment: (NOTE) Pre diabetes:          5.7%-6.4% Diabetes:              >6.4% Glycemic control for   <7.0% adults with diabetes    Mean Plasma Glucose 96.8 mg/dL    Comment: Performed at [facility], [address], [place], [place] [address]  Prolactin     Status: Abnormal   Collection Time: 10/15/17  7:43 AM  Result Value Ref Range   Prolactin 58.5 (H) 4.0 - 15.2 ng/mL    Comment: (NOTE) Performed At: [facility] [address] [place], [place] [id number] Jolene Schimke MD [id number] Performed at [facility], [address] [place], [place] [address]     Blood Alcohol level:  Lab Results  Component Value Date   ETH 135 (H) 10/09/2017   ETH <5 08/21/2016  Metabolic Disorder Labs: Lab Results  Component Value Date   HGBA1C 5.0 10/15/2017   MPG 96.8 10/15/2017   Lab Results  Component Value Date   PROLACTIN 58.5 (H) 10/15/2017   No results found for: CHOL, TRIG, HDL, CHOLHDL, VLDL, LDLCALC  Physical Findings: AIMS: Facial and Oral Movements Muscles of Facial Expression: None, normal Lips and Perioral Area: None, normal Jaw: None, normal Tongue: None, normal,Extremity Movements Upper (arms, wrists, hands, fingers): None, normal Lower (legs, knees, ankles, toes): None, normal, Trunk Movements Neck, shoulders, hips: None, normal, Overall Severity Severity of abnormal movements (highest score from questions above): None, normal Incapacitation due to abnormal movements: None, normal Patient's awareness of abnormal movements (rate only patient's report): No Awareness, Dental Status Current problems with teeth and/or dentures?: No Does patient usually wear dentures?: No  CIWA:    COWS:     Musculoskeletal: Strength & Muscle Tone: within normal limits Gait & Station: normal Patient leans: N/A  Psychiatric Specialty Exam: Physical Exam  Nursing note and vitals reviewed. Constitutional: He is oriented to person, place, and time. He appears well-developed.  Cardiovascular: Normal rate.   Neurological: He is alert and oriented to person, place, and time.  Psychiatric: He has a normal mood and affect. His behavior is  normal.    Review of Systems  Psychiatric/Behavioral: Negative for depression, hallucinations and suicidal ideas. The patient is not nervous/anxious.   All other systems reviewed and are negative.   Blood pressure 131/75, pulse 85, temperature 98.6 F (37 C), temperature source Oral, resp. rate 16, height 5' 11.5" (1.816 m), weight 72.6 kg (160 lb), SpO2 100 %.Body mass index is 22 kg/m.  General Appearance: Casual and Guarded  Eye Contact:  Good  Speech:  Clear and Coherent and Normal Rate  Volume:  Normal  Mood:  Euthymic  Affect:  Congruent and Constricted  Thought Process:  Goal Directed  Orientation:  Full (Time, Place, and Person)  Thought Content:  Logical  Suicidal Thoughts:  No  Homicidal Thoughts:  No  Memory:  Immediate;   Good Recent;   Good Remote;   Good  Judgement:  Fair  Insight:  Fair  Psychomotor Activity:  Normal  Concentration:  Concentration: Good  Recall:  Good  Fund of Knowledge:  Good  Language:  Fair  Akathisia:  No  Handed:    AIMS (if indicated):     Assets:  Communication Skills Desire for Improvement Physical Health Social Support  ADL's:  Intact  Cognition:  WNL  Sleep:  Number of Hours: 6     Treatment Plan Summary: Daily contact with patient to assess and evaluate symptoms and progress in treatment and Medication management.  Continue with current treatment plan on 10/16/2017 except where noted  - Bipolar Disorder I             - Continue Depakote ER 500 mg qhs             - Continue haldol 2mg  BID           - Encourage participation in groups and therapeutic milieu - Increase collateral information - Discharge planning will be ongoing - Continue inpatient hospitalization  Oneta Rack, NP 10/16/2017, 3:12 PM   Agree with NP Progress Note

## 2017-10-16 NOTE — Plan of Care (Signed)
Problem: Safety: Goal: Periods of time without injury will increase Outcome: Progressing Patient is safe and free from injury on the unit.  Routine safety checks maintained every 15 minutes.

## 2017-10-16 NOTE — Progress Notes (Signed)
DAR NOTE: Patient presents with anxious affect and mood.  Denies pain, auditory and visual hallucinations.  Described energy level as normal and concentration as good.  Rates depression at 0, hopelessness at 0, and anxiety at 3.  Maintained on routine safety checks.  Medications given as prescribed.  Support and encouragement offered as needed.  Attended group and participated.  Patient observed socializing with peers in the dayroom.  Offered no complaint.

## 2017-10-16 NOTE — Progress Notes (Signed)
Patient has been up and active on the unit, attended group this evening and participated. Patient currently denies si/hi/a/v hall. Support and encouragement offered, safety maintained on unit, will continue to monitor.
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
## 2017-10-17 ENCOUNTER — Other Ambulatory Visit: Payer: Self-pay

## 2017-10-17 LAB — VALPROIC ACID LEVEL: Valproic Acid Lvl: 30 ug/mL — ABNORMAL LOW (ref 50.0–100.0)

## 2017-10-17 MED ORDER — HALOPERIDOL 2 MG PO TABS: 2.5000 mg | ORAL_TABLET | Freq: Two times a day (BID) | ORAL | Status: DC
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
## 2017-10-17 NOTE — Progress Notes (Signed)
Nursing Note 10/17/2017 [id number]  Data  Did not complete self-inventory.  Patient observed smiling and active in milieu.  Denied SI, HI, and AVH.  Attending groups.  Action Spoke with patient 1:1, nurse offered support to patient throughout shift.  Continues to be monitored on 15 minute checks for safety.  Response Remains safe on unit.

## 2017-10-17 NOTE — [facility] Group Notes (Signed)
[facility] LCSW Group Therapy Note  Date/Time:  10/17/2017  11:00AM-12:00PM  Type of Therapy and Topic:  Group Therapy:  Music and Mood  Participation Level:  Minimal   Description of Group: In this process group, members listened to a variety of genres of music and identified that different types of music evoke different responses.  Patients were encouraged to identify music that was soothing for them and music that was energizing for them.  Patients discussed how this knowledge can help with wellness and recovery in various ways including managing depression and anxiety as well as encouraging healthy sleep habits.    Therapeutic Goals: 1. Patients will explore the impact of different varieties of music on mood 2. Patients will verbalize the thoughts they have when listening to different types of music 3. Patients will identify music that is soothing to them as well as music that is energizing to them 4. Patients will discuss how to use this knowledge to assist in maintaining wellness and recovery 5. Patients will explore the use of music as a coping skill  Summary of Patient Progress:  Pt arrived about halfway through group, left once but returned, did not seem to respond to any music, but said at the end of group he felt better.  Therapeutic Modalities: Solution Focused Brief Therapy Motivational Interviewing Activity   Ambrose MantleMareida Grossman-Orr, LCSW 10/17/2017 12:19 PM

## 2017-10-17 NOTE — Progress Notes (Signed)
D: Pt denies SI/HI/VH, +ve AH- non command. Pt is pleasant and cooperative. Pt stated she has been having AH for a long time, but has not told anyone until now. Pt continues to keep to himself on the unit , but will be visible on the unit at times.   A: Pt was offered support and encouragement. Pt was given scheduled medications. Pt was encourage to attend groups. Q 15 minute checks were done for safety.   R:Pt attends groups and interacts  with peers and staff. Pt is taking medication. Pt has no complaints.Pt receptive to treatment and safety maintained on unit.

## 2017-10-18 MED ORDER — HALOPERIDOL 5 MG PO TABS
2.5000 mg | ORAL_TABLET | Freq: Two times a day (BID) | ORAL | Status: DC
  Filled 2017-10-18 (×2): qty 1

## 2017-10-18 MED ORDER — BENZTROPINE MESYLATE 0.5 MG PO TABS: 0.5000 mg | ORAL_TABLET | Freq: Every day | ORAL | 0 refills | Status: DC

## 2017-10-18 MED ORDER — HALOPERIDOL 0.5 MG PO TABS: 2.5000 mg | ORAL_TABLET | Freq: Two times a day (BID) | ORAL | 0 refills | Status: DC

## 2017-10-18 MED ORDER — DIVALPROEX SODIUM ER 500 MG PO TB24: 500.0000 mg | ORAL_TABLET | Freq: Every day | ORAL | 0 refills | Status: DC

## 2017-10-18 MED ORDER — TRAZODONE HCL 50 MG PO TABS
50.0000 mg | ORAL_TABLET | Freq: Every day | ORAL | Status: DC
  Filled 2017-10-18: qty 7

## 2017-10-18 MED ORDER — TRAZODONE HCL 50 MG PO TABS: ORAL_TABLET | ORAL | 0 refills | Status: DC

## 2017-10-18 NOTE — Progress Notes (Signed)
Patient discharged to lobby. Patient was stable and appreciative at that time. All papers, samples and prescriptions were given and valuables returned. Verbal understanding expressed. Denies SI/HI and A/VH. Patient given opportunity to express concerns and ask questions.  

## 2017-10-18 NOTE — Progress Notes (Signed)
  [facility]  Will you be returning to the same living situation after discharge:  Yes,  home At discharge, do you have transportation home?: Yes,  family Do you have the ability to pay for your medications: Yes,  mental health  Release of information consent forms completed and in the chart;  Patient's signature needed at discharge.  Patient to Follow up at: Follow-up Information    [facility] up.   Contact information: [address] [place] [id number] [phone]           Next level of care provider has access to [facility] Link:no  Safety Planning and Suicide Prevention discussed: Yes,  yes  Have you used any form of tobacco in the last 30 days? (Cigarettes, Smokeless Tobacco, Cigars, and/or Pipes): Yes  Has patient been referred to the Quitline?: Patient refused referral  Patient has been referred for addiction treatment: Pt. refused referral  Ida RogueRodney B Sharlyne Koeneman, LCSW 10/18/2017, 10:30 AM
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
## 2017-10-18 NOTE — Tx Team (Signed)
Interdisciplinary Treatment and Diagnostic Plan Update  10/18/2017 Time of Session: 10:28 AM  David [name] [place] MRN: [id number]  Principal Diagnosis: SCHIZOPHRENIA DISORDER  Secondary Diagnoses: Principal Problem:   Bipolar I disorder, most recent episode (or current) manic (HCC) Active Problems:   Psychosis (HCC)   Current Medications:  Current Facility-Administered Medications  Medication Dose Route Frequency Provider Last Rate Last Dose  . acetaminophen (TYLENOL) tablet 650 mg  650 mg Oral Q6H PRN Laveda AbbeParks, Laurie Britton, NP   650 mg at 10/17/17 2136  . alum & mag hydroxide-simeth (MAALOX/MYLANTA) 200-200-20 MG/5ML suspension 30 mL  30 mL Oral Q4H PRN Laveda AbbeParks, Laurie Britton, NP      . benztropine (COGENTIN) tablet 0.5 mg  0.5 mg Oral Daily Laveda AbbeParks, Laurie Britton, NP   0.5 mg at 10/18/17 [id number]  . divalproex (DEPAKOTE ER) 24 hr tablet 500 mg  500 mg Oral QHS Nwoko, Agnes I, NP   500 mg at 10/17/17 2135  . haloperidol (HALDOL) tablet 2.5 mg  2.5 mg Oral BID Cobos, Rockey SituFernando A, MD   2.5 mg at 10/18/17 [id number]  . ibuprofen (ADVIL,MOTRIN) tablet 600 mg  600 mg Oral Q8H PRN Laveda AbbeParks, Laurie Britton, NP      . magnesium hydroxide (MILK OF MAGNESIA) suspension 30 mL  30 mL Oral Daily PRN Laveda AbbeParks, Laurie Britton, NP      . ondansetron [facility](ZOFRAN) tablet 4 mg  4 mg Oral Q8H PRN Laveda AbbeParks, Laurie Britton, NP      . traZODone (DESYREL) tablet 50 mg  50 mg Oral QHS PRN,MR X 1 Laveda AbbeParks, Laurie Britton, NP   50 mg at 10/17/17 2135    PTA Medications: No medications prior to admission.    Treatment Modalities: Medication Management, Group therapy, Case management,  1 to 1 session with clinician, Psychoeducation, Recreational therapy.  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 Motivation for treatment/growth Physical Health   Physician Treatment Plan for Primary Diagnosis: SCHIZOPHRENIA DISORDER Long Term  Goal(s): Improvement in symptoms so as ready for discharge  Short Term Goals: Ability to identify changes in lifestyle to reduce recurrence of condition will improve Ability to verbalize feelings will improve Ability to demonstrate self-control will improve Ability to identify and develop effective coping behaviors will improve Compliance with prescribed medications will improve Ability to identify triggers associated with substance abuse/mental health issues will improve  Medication Management: Evaluate patient's response, side effects, and tolerance of medication regimen.  Therapeutic Interventions: 1 to 1 sessions, Unit Group sessions and Medication administration.  Evaluation of Outcomes: Adequate for Discharge  Physician Treatment Plan for Secondary Diagnosis: Principal Problem:   Bipolar I disorder, most recent episode (or current) manic (HCC) Active Problems:   Psychosis (HCC)  Long Term Goal(s): Improvement in symptoms so as ready for discharge  Short Term Goals: Ability to identify changes in lifestyle to reduce recurrence of condition will improve Ability to verbalize feelings will improve Ability to demonstrate self-control will improve Ability to identify and develop effective coping behaviors will improve Compliance with prescribed medications will improve Ability to identify triggers associated with substance abuse/mental health issues will improve  Medication Management: Evaluate patient's response, side effects, and tolerance of medication regimen.  Therapeutic Interventions: 1 to 1 sessions, Unit Group sessions and Medication administration.  Evaluation of Outcomes: Adequate for Discharge   RN Treatment Plan for Primary Diagnosis: SCHIZOPHRENIA DISORDER Long Term Goal(s): Knowledge of disease and therapeutic regimen to maintain health will improve  Short Term Goals: Ability to participate in decision making will improve, Ability to verbalize feelings will  improve, Ability to identify and develop effective coping behaviors will improve and Compliance with prescribed medications will improve  Medication Management: RN will administer medications as ordered by provider, will assess and evaluate patient's response and provide education to patient for prescribed medication. RN will report any adverse and/or side effects to prescribing provider.  Therapeutic Interventions: 1 on 1 counseling sessions, Psychoeducation, Medication administration, Evaluate responses to treatment, Monitor vital signs and CBGs as ordered, Perform/monitor CIWA, COWS, AIMS and Fall Risk screenings as ordered, Perform wound care treatments as ordered.  Evaluation of Outcomes: Adequate for Discharge   LCSW Treatment Plan for Primary Diagnosis: SCHIZOPHRENIA DISORDER Long Term Goal(s): Safe transition to appropriate next level of care at discharge, Engage patient in therapeutic group addressing interpersonal concerns.  Short Term Goals: Engage patient in aftercare planning with referrals and resources, Facilitate acceptance of mental health diagnosis and concerns, Identify triggers associated with mental health/substance abuse issues and Increase skills for wellness and recovery  Therapeutic Interventions: Assess for all discharge needs, 1 to 1 time with Social worker, Explore available resources and support systems, Assess for adequacy in community support network, Educate family and significant other(s) on suicide prevention, Complete Psychosocial Assessment, Interpersonal group therapy.  Evaluation of Outcomes: Adequate for Discharge   Progress in Treatment: Attending groups: No Participating in groups: No Taking medication as prescribed: Yes Toleration of medication: Yes, no side effects reported at this time Family/Significant other contact made: Anthonette Legato [phone] Patient understands diagnosis: No, limited insight  Discussing patient identified  problems/goals with staff: Yes Medical problems stabilized or resolved: Yes Denies suicidal/homicidal ideation: Yes Issues/concerns per patient self-inventory: None Other: N/A  New problem(s) identified: None identified at this time.   New Short Term/Long Term Goal(s): Pt states that "There is nothing you can do for me that I can't do for myself".   Discharge Plan or Barriers: Upon discharge he will return home with his mother to their trailer at [facility], follow up [facility]   Reason for Continuation of Hospitalization:    Estimated Length of Stay: D/C today  Attendees: Patient:   10/18/2017  10:28 AM  Physician: Jolyne Loa , MD 10/18/2017  10:28 AM  Nursing: Estella Husk, RN 10/18/2017  10:28 AM  RN Care Manager: Onnie Boer, RN 10/18/2017  10:28 AM  Social Worker: Richelle Ito, LCSW; Melba Coon, Social Work Intern 10/18/2017  10:28 AM  Recreational Therapist: [name], LRT 10/18/2017  10:28 AM  Other: Tomasita Morrow, P4CC 10/18/2017  10:28 AM  Other:  10/18/2017  10:28 AM  Other: 10/18/2017  10:28 AM    Scribe for Treatment Team: Ida Rogue, LCSW 10/18/2017 10:28 AM

## 2017-10-18 NOTE — Progress Notes (Signed)
Recreation Therapy Notes  Date: 10/18/17 Time: 1015 Location: [address] Dayroom  Group Topic: Leisure Education  Goal Area(s) Addresses:  Patient will identify positive leisure activities.  Patient will identify one positive benefit of participation in leisure activities.   Behavioral Response: Engaged  Intervention: Scientist, clinical (histocompatibility and immunogenetics)Construction paper, markers, glue sticks, scissors  Activity: Leisure Science writerBrochure.  Patients were to create [name] brochure that highlighted their leisure interests.  Patients were to also give [name] brief description of why they chose the activities they did.  Education:  Leisure Education, Building control surveyorDischarge Planning  Education Outcome: Acknowledges education/In group clarification offered/Needs additional education  Clinical Observations/Feedback: Pt was bright and engaged during activity.  Pt expressed participating leisure helps "get you to feel well".  Pt stated we do make time for leisure because "you can find leisure in anything, it's whatever you want to do".  Pt stated he likes bowling.  Pt also expressed that if you don't do leisure, "you won't be happy".    Caroll RancherMarjette Itzel [name], LRT/CTRS         Caroll RancherLindsay, [name] 10/18/2017 11:57 AM
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
## 2017-10-22 ENCOUNTER — Emergency Department (HOSPITAL_COMMUNITY)
Admission: EM | Admit: 2017-10-22 | Discharge: 2017-10-23 | Disposition: A | Discharge status: Home / Self Care | Payer: Self-pay | Attending: Emergency Medicine | Admitting: Emergency Medicine

## 2017-10-22 ENCOUNTER — Encounter (HOSPITAL_COMMUNITY): Payer: Self-pay | Admitting: Emergency Medicine

## 2017-10-22 ENCOUNTER — Other Ambulatory Visit: Payer: Self-pay

## 2017-10-22 DIAGNOSIS — G2402 Drug induced acute dystonia: Secondary | ICD-10-CM | POA: Insufficient documentation

## 2017-10-22 HISTORY — DX: Suicidal ideations: R45.851

## 2017-10-22 HISTORY — DX: Unspecified psychosis not due to a substance or known physiological condition: F29

## 2017-10-22 LAB — CBC WITH DIFFERENTIAL/PLATELET
BASOS ABS: 0 10*3/uL (ref 0.0–0.1)
BASOS PCT: 0 %
EOS PCT: 2 %
Eosinophils Absolute: 0.2 10*3/uL (ref 0.0–0.7)
HCT: 41.8 % (ref 39.0–52.0)
Hemoglobin: 14.1 g/dL (ref 13.0–17.0)
LYMPHS ABS: 1.7 10*3/uL (ref 0.7–4.0)
Lymphocytes Relative: 13 %
MCH: 28.7 pg (ref 26.0–34.0)
MCHC: 33.7 g/dL (ref 30.0–36.0)
MCV: 85.1 fL (ref 78.0–100.0)
MONO ABS: 1.3 10*3/uL — AB (ref 0.1–1.0)
Monocytes Relative: 10 %
NEUTROS ABS: 9.9 10*3/uL — AB (ref 1.7–7.7)
Neutrophils Relative %: 75 %
PLATELETS: 265 10*3/uL (ref 150–400)
RBC: 4.91 MIL/uL (ref 4.22–5.81)
RDW: 12.5 % (ref 11.5–15.5)
WBC: 13.1 10*3/uL — ABNORMAL HIGH (ref 4.0–10.5)

## 2017-10-22 LAB — BASIC METABOLIC PANEL
ANION GAP: 10 (ref 5–15)
BUN: 8 mg/dL (ref 6–20)
CHLORIDE: 104 mmol/L (ref 101–111)
CO2: 22 mmol/L (ref 22–32)
CREATININE: 1 mg/dL (ref 0.61–1.24)
Calcium: 9.4 mg/dL (ref 8.9–10.3)
GFR calc non Af Amer: >60 mL/min (ref >60)
Glucose, Bld: 102 mg/dL — ABNORMAL HIGH (ref 65–99)
Potassium: 3.7 mmol/L (ref 3.5–5.1)
Sodium: 136 mmol/L (ref 135–145)

## 2017-10-22 MED ORDER — DIPHENHYDRAMINE HCL 50 MG/ML IJ SOLN
25.0000 mg | Freq: Once | INTRAMUSCULAR | Status: AC
  Administered 2017-10-22: 25 mg via INTRAVENOUS
  Filled 2017-10-22: qty 1

## 2017-10-22 MED ORDER — SODIUM CHLORIDE 0.9 % IV BOLUS (SEPSIS)
1000.0000 mL | Freq: Once | INTRAVENOUS | Status: AC
  Administered 2017-10-22: 1000 mL via INTRAVENOUS

## 2017-10-22 NOTE — ED Triage Notes (Signed)
Patient reports posterior neck pain / stiffness with intermittent legs tremors onset today , denies injury , mother stated that he has not taken his prescribed psychoactive medications for his psychosis .

## 2017-10-22 NOTE — ED Notes (Signed)
Pt yelling out, states he cannot control his bodily movements and is sitting up in bed with arms up, muscled rigid with arms raised. Bilateral bed rails raised, pt provided with bedsheet, lights dimmed for relaxation. Will notify MD

## 2017-10-22 NOTE — ED Notes (Signed)
[facility]. Pt has been recently placed on Depakote and has been taking them intermittently. Pt snorted 1 0.5 mg tablet of benzotropine yesterday.

## 2017-10-22 NOTE — ED Notes (Signed)
Pt attempting to provide urine sample at this time

## 2017-10-22 NOTE — ED Notes (Signed)
MD made aware of pt condition. Security not available to observe pt at this time

## 2017-10-22 NOTE — ED Provider Notes (Signed)
[facility] Provider Note   CSN: [id number] Arrival date & time: 10/22/17  1902     History   Chief Complaint Chief Complaint  Patient presents with  . Neck Pain/Stiff    HPI David [name] is a [age] y.o. male.  Patient is a 21 year old male with history of Bipolar Disorder, Psychosis [facility] He was started on Depakote, Cogentin, trazodone.  He began taking these medications this week, however has not been taking them reliably.  He presents this evening complaining of muscle spasms.  He reports spasms of his neck, arms, legs.  He denies any illicit drug or alcohol use.  He denies any fevers or chills.  He denies any headache.   The history is provided by the patient.    Past Medical History:  Diagnosis Date  . Psychosis (HCC)   . Suicidal ideation     Patient Active Problem List   Diagnosis Date Noted  . Bipolar I disorder, most recent episode (or current) manic (HCC) 10/14/2017  . Psychosis (HCC) 10/13/2017  . 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 Avulsion of left ear 08/24/2016  . Multiple fractures of cervical spine (HCC) 08/24/2016  . Mesenteric hematoma 08/24/2016  . Serosal tear of colon 08/24/2016  . Multiple abrasions 08/24/2016  . Open fracture of right tibia and fibula 08/24/2016  . Multiple closed tarsal fractures of left foot 08/24/2016  . Acute blood loss anemia 08/24/2016  . Acute urinary retention 08/24/2016  . Blunt trauma 08/22/2016  . Aggressive behavior     History reviewed. No pertinent surgical history.     Home Medications    Prior to Admission medications   Medication Sig Start Date End Date Taking? Authorizing Provider  benztropine (COGENTIN) 0.5 MG tablet Take 1 tablet (0.5 mg total) daily by mouth. For prevention of drug induced tremors 10/19/17  Yes Nwoko, Nicole KindredAgnes I, NP  divalproex (DEPAKOTE ER) 500 MG 24  hr tablet Take 1 tablet (500 mg total) at bedtime by mouth. For mood stabilization 10/18/17  Yes Nwoko, Nicole KindredAgnes I, NP  haloperidol (HALDOL) 0.5 MG tablet Take 5 tablets (2.5 mg total) 2 (two) times daily by mouth. For mood control 10/18/17  Yes Armandina StammerNwoko, Agnes I, NP  traZODone (DESYREL) 50 MG tablet Take 1 tablet (50 mg) by mouth at bedtime: For sleep 10/18/17  Yes Sanjuana KavaNwoko, Agnes I, NP    Family History No family history on file.  Social History Social History   Tobacco Use  . Smoking status: Current Every Day Smoker    Packs/day: 1.00  . Smokeless tobacco: Never Used  Substance Use Topics  . Alcohol use: Yes  . Drug use: No     Allergies   Patient has no known allergies.   Review of Systems Review of Systems  All other systems reviewed and are negative.    Physical Exam Updated Vital Signs BP (!) 142/85   Pulse (!) 101   Temp 98.2 F (36.8 C) (Oral)   Resp 18   SpO2 98%   Physical Exam  Constitutional: He is oriented to person, place, and time. He appears well-developed and well-nourished. No distress.  HENT:  Head: Normocephalic and atraumatic.  Mouth/Throat: Oropharynx is clear and moist.  Neck: Normal range of motion. Neck supple.  Cardiovascular: Normal rate and regular rhythm. Exam reveals no friction rub.  No murmur heard. Pulmonary/Chest: Effort normal and breath sounds normal.  No respiratory distress. He has no wheezes. He has no rales.  Abdominal: Soft. Bowel sounds are normal. He exhibits no distension. There is no tenderness.  Musculoskeletal: Normal range of motion. He exhibits no edema.  Neurological: He is alert and oriented to person, place, and time. Coordination normal.  He is noted to have some spasticity/dystonia with his arms, legs, and neck.  Skin: Skin is warm and dry. He is not diaphoretic.  Nursing note and vitals reviewed.    ED Treatments / Results  Labs (all labs ordered are listed, but only abnormal results are displayed) Labs Reviewed    RAPID URINE DRUG SCREEN, HOSP PERFORMED  BASIC METABOLIC PANEL  CBC WITH DIFFERENTIAL/PLATELET  ETHANOL  URINALYSIS, ROUTINE W REFLEX MICROSCOPIC    EKG  EKG Interpretation None       Radiology No results found.  Procedures Procedures (including critical care time)  Medications Ordered in ED Medications  sodium chloride 0.9 % bolus 1,000 mL (not administered)  diphenhydrAMINE (BENADRYL) injection 25 mg (not administered)     Initial Impression / Assessment and Plan / ED Course  I have reviewed the triage vital signs and the nursing notes.  Pertinent labs & imaging results that were available during my care of the patient were reviewed by me and considered in my medical decision making (see chart for details).  Patient presenting with muscle spasms that I suspect are a dystonic reaction related to psychotropic medications.  These were recently started and he has been taking them intermittently.  He appears otherwise stable from a physical and psychiatric standpoint.  He was given IV Benadryl along with fluids and his symptoms have significantly improved.  I feel as though he is appropriate [facility].  Final Clinical Impressions(s) / ED Diagnoses   Final diagnoses:  None    ED Discharge Orders    None       Geoffery Lyonselo, Moris Ratchford, MD 10/23/17 [phone]

## 2017-10-22 NOTE — ED Notes (Signed)
Pt has tremors need assistance holding pt's arm still.   Nurse will start IV and I will assist holding.

## 2017-10-23 LAB — URINALYSIS, ROUTINE W REFLEX MICROSCOPIC
Bilirubin Urine: NEGATIVE
GLUCOSE, UA: NEGATIVE mg/dL
HGB URINE DIPSTICK: NEGATIVE
Ketones, ur: NEGATIVE mg/dL
Leukocytes, UA: NEGATIVE
Nitrite: NEGATIVE
PH: 7 (ref 5.0–8.0)
Protein, ur: NEGATIVE mg/dL
SPECIFIC GRAVITY, URINE: 1.012 (ref 1.005–1.030)

## 2017-10-23 LAB — RAPID URINE DRUG SCREEN, HOSP PERFORMED
Amphetamines: NOT DETECTED
BARBITURATES: NOT DETECTED
Benzodiazepines: NOT DETECTED
COCAINE: NOT DETECTED
Opiates: NOT DETECTED
Tetrahydrocannabinol: NOT DETECTED

## 2017-10-23 LAB — VALPROIC ACID LEVEL: Valproic Acid Lvl: <10 ug/mL — ABNORMAL LOW (ref 50.0–100.0)

## 2017-10-23 LAB — ETHANOL

## 2017-10-23 NOTE — Discharge Instructions (Signed)
Benadryl 50 mg every 8 hours as needed if symptoms recur.  Follow-up with [facility] to discuss your medications.  Return to the ER if symptoms significantly worsen or change.

## 2017-10-23 NOTE — ED Notes (Signed)
Pt verbalized understanding of d/c instructions and has no further questions. Pt is stable, A&Ox4, VSS.  

## 2017-12-25 ENCOUNTER — Emergency Department (HOSPITAL_COMMUNITY)
Admission: EM | Admit: 2017-12-25 | Discharge: 2017-12-29 | Disposition: A | Discharge status: Home / Self Care | Payer: Self-pay | Attending: Emergency Medicine | Admitting: Emergency Medicine

## 2017-12-25 ENCOUNTER — Encounter (HOSPITAL_COMMUNITY): Payer: Self-pay

## 2017-12-25 DIAGNOSIS — Z79899 Other long term (current) drug therapy: Secondary | ICD-10-CM | POA: Insufficient documentation

## 2017-12-25 DIAGNOSIS — F28 Other psychotic disorder not due to a substance or known physiological condition: Secondary | ICD-10-CM

## 2017-12-25 DIAGNOSIS — Z046 Encounter for general psychiatric examination, requested by authority: Secondary | ICD-10-CM

## 2017-12-25 DIAGNOSIS — R4689 Other symptoms and signs involving appearance and behavior: Secondary | ICD-10-CM

## 2017-12-25 DIAGNOSIS — F17210 Nicotine dependence, cigarettes, uncomplicated: Secondary | ICD-10-CM | POA: Insufficient documentation

## 2017-12-25 DIAGNOSIS — F29 Unspecified psychosis not due to a substance or known physiological condition: Secondary | ICD-10-CM | POA: Insufficient documentation

## 2017-12-25 DIAGNOSIS — R451 Restlessness and agitation: Secondary | ICD-10-CM | POA: Insufficient documentation

## 2017-12-25 DIAGNOSIS — F319 Bipolar disorder, unspecified: Secondary | ICD-10-CM | POA: Insufficient documentation

## 2017-12-25 DIAGNOSIS — Z23 Encounter for immunization: Secondary | ICD-10-CM | POA: Insufficient documentation

## 2017-12-25 LAB — CBC
HEMATOCRIT: 49.1 % (ref 39.0–52.0)
HEMOGLOBIN: 17 g/dL (ref 13.0–17.0)
MCH: 29.1 pg (ref 26.0–34.0)
MCHC: 34.6 g/dL (ref 30.0–36.0)
MCV: 83.9 fL (ref 78.0–100.0)
Platelets: 320 10*3/uL (ref 150–400)
RBC: 5.85 MIL/uL — ABNORMAL HIGH (ref 4.22–5.81)
RDW: 11.7 % (ref 11.5–15.5)
WBC: 6.6 10*3/uL (ref 4.0–10.5)

## 2017-12-25 LAB — COMPREHENSIVE METABOLIC PANEL
ALT: 13 U/L — AB (ref 17–63)
AST: 22 U/L (ref 15–41)
Albumin: 4.7 g/dL (ref 3.5–5.0)
Alkaline Phosphatase: 62 U/L (ref 38–126)
Anion gap: 10 (ref 5–15)
BILIRUBIN TOTAL: 0.4 mg/dL (ref 0.3–1.2)
BUN: 6 mg/dL (ref 6–20)
CALCIUM: 9.8 mg/dL (ref 8.9–10.3)
CO2: 26 mmol/L (ref 22–32)
Chloride: 104 mmol/L (ref 101–111)
Creatinine, Ser: 0.94 mg/dL (ref 0.61–1.24)
GFR calc non Af Amer: >60 mL/min (ref >60)
GLUCOSE: 98 mg/dL (ref 65–99)
Potassium: 4 mmol/L (ref 3.5–5.1)
SODIUM: 140 mmol/L (ref 135–145)
Total Protein: 8.4 g/dL — ABNORMAL HIGH (ref 6.5–8.1)

## 2017-12-25 LAB — VALPROIC ACID LEVEL: Valproic Acid Lvl: <10 ug/mL — ABNORMAL LOW (ref 50.0–100.0)

## 2017-12-25 LAB — ACETAMINOPHEN LEVEL: Acetaminophen (Tylenol), Serum: <10 ug/mL — ABNORMAL LOW (ref 10–30)

## 2017-12-25 LAB — SALICYLATE LEVEL

## 2017-12-25 LAB — ETHANOL: Alcohol, Ethyl (B): 35 mg/dL — ABNORMAL HIGH (ref <10)

## 2017-12-25 MED ORDER — BENZTROPINE MESYLATE 1 MG PO TABS
0.5000 mg | ORAL_TABLET | Freq: Every day | ORAL | Status: DC
  Administered 2017-12-25 – 2017-12-29 (×5): 0.5 mg via ORAL
  Filled 2017-12-25 (×5): qty 1

## 2017-12-25 MED ORDER — DIVALPROEX SODIUM ER 500 MG PO TB24
500.0000 mg | ORAL_TABLET | Freq: Every day | ORAL | Status: DC
  Administered 2017-12-25 – 2017-12-28 (×4): 500 mg via ORAL
  Filled 2017-12-25 (×4): qty 1

## 2017-12-25 MED ORDER — TRAZODONE HCL 50 MG PO TABS
50.0000 mg | ORAL_TABLET | Freq: Every day | ORAL | Status: DC
  Administered 2017-12-25 – 2017-12-28 (×4): 50 mg via ORAL
  Filled 2017-12-25 (×4): qty 1

## 2017-12-25 MED ORDER — HALOPERIDOL 5 MG PO TABS
2.5000 mg | ORAL_TABLET | Freq: Two times a day (BID) | ORAL | Status: DC
  Administered 2017-12-25 – 2017-12-29 (×9): 2.5 mg via ORAL
  Filled 2017-12-25 (×9): qty 1

## 2017-12-25 MED ORDER — TETANUS-DIPHTH-ACELL PERTUSSIS 5-2.5-18.5 LF-MCG/0.5 IM SUSP
0.5000 mL | Freq: Once | INTRAMUSCULAR | Status: AC
  Administered 2017-12-25: 0.5 mL via INTRAMUSCULAR
  Filled 2017-12-25: qty 0.5

## 2017-12-25 NOTE — ED Notes (Signed)
TTS being performed.  

## 2017-12-25 NOTE — ED Notes (Signed)
[facility] burgundy [facility]. Pt not speaking. Pt following instructions slowly. Pt given warm blanket. Pt sat on bed and watching tv - now standing in room. Encouraging pt to sit on bed for comfort.
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
## 2017-12-25 NOTE — ED Notes (Signed)
This nurse, sheriff, and EMT attempting to get pt to get into scrubs.

## 2017-12-25 NOTE — ED Notes (Signed)
Very poor hygiene he could use a soap and water bath

## 2017-12-25 NOTE — ED Notes (Signed)
Pt using phone to call mother

## 2017-12-25 NOTE — ED Notes (Signed)
The pt says very little needs much encouragement to take prescribed medicine.  After approx 15 minutes he took his meds offered.  Not very verbal  He says huh quite often.  Unable to determine whether he understands questions asked  He did ask about his mother who lkeft before I took over his care

## 2017-12-25 NOTE — ED Notes (Signed)
Pt aware of need for urine specimen. 

## 2017-12-25 NOTE — ED Notes (Signed)
Called lab Valproic acid level added on to blood already in lab.
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
## 2017-12-25 NOTE — ED Notes (Signed)
Pt has now changed into scrubs.  Security wanded pt.
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
## 2017-12-25 NOTE — ED Provider Notes (Signed)
[name] EMERGENCY DEPARTMENT Provider Note   CSN: [id number] Arrival date & time: 12/25/17  1238     History   Chief Complaint Chief Complaint  Patient presents with  . Aggressive Behavior    HPI David [name] is a 22 y.o. male.  HPI Patient presents to the emergency department with violent behavior and psychosis along with suicidal ideations.  Patient has been involuntarily committed the patient will not give me any history at this time.  Patient was committed by his family due to harm to himself and others along with psychosis from his schizophrenia. Past Medical History:  Diagnosis Date  . Psychosis (HCC)   . Suicidal ideation     Patient Active Problem List   Diagnosis Date Noted  . Bipolar I disorder, most recent episode (or current) manic (HCC) 10/14/2017  . Psychosis (HCC) 10/13/2017  . 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 Avulsion of left ear 08/24/2016  . Multiple fractures of cervical spine (HCC) 08/24/2016  . Mesenteric hematoma 08/24/2016  . Serosal tear of colon 08/24/2016  . Multiple abrasions 08/24/2016  . Open fracture of right tibia and fibula 08/24/2016  . Multiple closed tarsal fractures of left foot 08/24/2016  . Acute blood loss anemia 08/24/2016  . Acute urinary retention 08/24/2016  . Blunt trauma 08/22/2016  . Aggressive behavior     Past Surgical History:  Procedure Laterality Date  . APPLICATION OF WOUND VAC Right 08/23/2016   Procedure: WOUND VAC EXCHANGE;  Surgeon: Tarry Kos, MD;  [facility];  Service: Orthopedics;  Laterality: Right;  . EXTERNAL FIXATION LEG  08/22/2016   Procedure: POSSIBLE EXTERNAL FIXATION LEG;  Surgeon: Gaynelle Adu, MD;  Location: [facility] OR;  Service: General;;  . EXTERNAL FIXATION REMOVAL Right 08/23/2016   Procedure: REMOVAL EXTERNAL FIXATION LEG;  Surgeon: Tarry Kos, MD;  [facility];  Service: Orthopedics;  Laterality: Right;   . I&D EXTREMITY Right 08/22/2016   Procedure: IRRIGATION AND DEBRIDEMENT RIGHT LEG AND CASTING;  Surgeon: Gaynelle Adu, MD;  Location: [facility] OR;  Service: General;  Laterality: Right;  . LAPAROSCOPY N/A 08/22/2016   Procedure: LAPAROSCOPY DIAGNOSTIC;  Surgeon: Gaynelle Adu, MD;  Location: [facility] OR;  Service: General;  Laterality: N/A;  . LAPAROTOMY N/A 08/22/2016   Procedure: Exploratory LAPAROTOMY repair of right colon serosal tear - pre-op blunt abdominal trauma;  Surgeon: Gaynelle Adu, MD;  Location: [facility] OR;  Service: General;  Laterality: N/A;  . OTOPLASATY Left 08/22/2016   Procedure: REPAIR OF LEFT EAR;  Surgeon: Gaynelle Adu, MD;  Location: [facility] OR;  Service: General;  Laterality: Left;  . TIBIA IM NAIL INSERTION Right 08/23/2016   Procedure: INTRAMEDULLARY (IM) NAIL TIBIAL;  Surgeon: Tarry Kos, MD;  [facility];  Service: Orthopedics;  Laterality: Right;       Home Medications    Prior to Admission medications   Medication Sig Start Date End Date Taking? Authorizing Provider  benztropine (COGENTIN) 0.5 MG tablet Take 1 tablet (0.5 mg total) daily by mouth. For prevention of drug induced tremors 10/19/17   Armandina Stammer I, NP  divalproex (DEPAKOTE ER) 500 MG 24 hr tablet Take 1 tablet (500 mg total) at bedtime by mouth. For mood stabilization 10/18/17   Armandina Stammer I, NP  haloperidol (HALDOL) 0.5 MG tablet Take 5 tablets (2.5 mg total) 2 (two) times daily by mouth. For mood control 10/18/17   Armandina Stammer I, NP  traZODone (  DESYREL) 50 MG tablet Take 1 tablet (50 mg) by mouth at bedtime: For sleep 10/18/17   Sanjuana Kava, NP    Family History History reviewed. No pertinent family history.  Social History Social History   Tobacco Use  . Smoking status: Current Every Day Smoker    Packs/day: 1.00  . Smokeless tobacco: Never Used  Substance Use Topics  . Alcohol use: Yes    Comment: last  drank beer 12-24-17  . Drug use: No     Allergies   Patient has no known allergies.   Review of  Systems Review of Systems Level 5 caveat applies due to psychosis and uncooperativeness  Physical Exam Updated Vital Signs BP 131/85 (BP Location: Right Arm)   Pulse 64   Temp 98.3 F (36.8 C) (Oral)   Resp 16   SpO2 100%   Physical Exam  Constitutional: He is oriented to person, place, and time. He appears well-developed and well-nourished. No distress.  HENT:  Head: Normocephalic and atraumatic.  Mouth/Throat: Oropharynx is clear and moist.  Eyes: Pupils are equal, round, and reactive to light.  Neck: Normal range of motion. Neck supple.  Cardiovascular: Normal rate, regular rhythm and normal heart sounds. Exam reveals no gallop and no friction rub.  No murmur heard. Pulmonary/Chest: Effort normal and breath sounds normal. No respiratory distress. He has no wheezes.  Abdominal: Soft. Bowel sounds are normal. He exhibits no distension. There is no tenderness.  Neurological: He is alert and oriented to person, place, and time. He exhibits normal muscle tone. Coordination normal.  Skin: Skin is warm and dry. Capillary refill takes less than 2 seconds. No rash noted. No erythema.  Psychiatric: He has a normal mood and affect. His speech is normal and behavior is normal. Thought content is delusional. He expresses suicidal ideation. He expresses suicidal plans.  Nursing note and vitals reviewed.    ED Treatments / Results  Labs (all labs ordered are listed, but only abnormal results are displayed) Labs Reviewed  COMPREHENSIVE METABOLIC PANEL - Abnormal; Notable for the following components:      Result Value   Total Protein 8.4 (*)    ALT 13 (*)    All other components within normal limits  ETHANOL - Abnormal; Notable for the following components:   Alcohol, Ethyl (B) 35 (*)    All other components within normal limits  ACETAMINOPHEN LEVEL - Abnormal; Notable for the following components:   Acetaminophen (Tylenol), Serum <10 (*)    All other components within normal limits    CBC - Abnormal; Notable for the following components:   RBC 5.85 (*)    All other components within normal limits  SALICYLATE LEVEL  RAPID URINE DRUG SCREEN, HOSP PERFORMED  VALPROIC ACID LEVEL    EKG  EKG Interpretation None       Radiology No results found.  Procedures Procedures (including critical care time)  Medications Ordered in ED Medications  haloperidol (HALDOL) tablet 2.5 mg (2.5 mg Oral Given 12/25/17 1544)  traZODone (DESYREL) tablet 50 mg (not administered)  divalproex (DEPAKOTE ER) 24 hr tablet 500 mg (500 mg Oral Given 12/25/17 1544)  benztropine (COGENTIN) tablet 0.5 mg (0.5 mg Oral Given 12/25/17 1545)  Tdap (BOOSTRIX) injection 0.5 mL (not administered)     Initial Impression / Assessment and Plan / ED Course  I have reviewed the triage vital signs and the nursing notes.  Pertinent labs & imaging results that were available during my care of the patient  were reviewed by me and considered in my medical decision making (see chart for details).    Patient will need TTS assessment for medicine noncompliance along with psychosis and suicidal and violent behavior.  Final Clinical Impressions(s) / ED Diagnoses   Final diagnoses:  None    ED Discharge Orders    None       Charlestine NightLawyer, Mailin Coglianese, PA-C 12/25/17 1613    Doug SouJacubowitz, Sam, MD 12/25/17 [phone]

## 2017-12-25 NOTE — ED Notes (Addendum)
Pt tolerated TDAP well. Sitter assisted. Urinal at bedside.
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
## 2017-12-25 NOTE — ED Notes (Signed)
Pt refusing to eat dinner even after much encouragement.

## 2017-12-26 LAB — RAPID URINE DRUG SCREEN, HOSP PERFORMED
AMPHETAMINES: NOT DETECTED
BENZODIAZEPINES: NOT DETECTED
Barbiturates: NOT DETECTED
COCAINE: NOT DETECTED
OPIATES: NOT DETECTED
Tetrahydrocannabinol: POSITIVE — AB

## 2017-12-26 NOTE — ED Notes (Signed)
Lying on bed, alert. Pt has eaten one bite of his meal. Encouraged pt to eat and drink water. States "I will".

## 2017-12-26 NOTE — ED Notes (Signed)
Breakfast at pt's bedside 

## 2017-12-26 NOTE — ED Notes (Signed)
Re-TTS being performed.  

## 2017-12-26 NOTE — Progress Notes (Signed)
Patient meets criteria for inpatient treatment. CSW faxed referrals to the following facilities for review:  [facility], [facility], [facility], [facility], [facility], [facility].   TTS will continue to seek bed placement.   Trula SladeHeather Smart, MSW, LCSW Clinical Social Worker 12/26/2017 9:58 AM
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
## 2017-12-26 NOTE — BHH Counselor (Signed)
Attempted re-assessment.  Pt was asleep, given Geodon.  Will call later.

## 2017-12-26 NOTE — ED Notes (Signed)
Pt returned from shower w/NT.
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
## 2017-12-26 NOTE — ED Notes (Addendum)
Pt noted to be talking more today. Woke pt so may administer meds and pt may eat lunch. Pt spoke w/his mother on phone - asking for her to come pick him up. Pt asked RN when she could come pick him up. Advised pt will not be today - pt voiced OK then advised his mother of the same. Offered for pt to shower x 2 d/t body odor. States he may later. Encouraged pt to eat lunch - states may later.

## 2017-12-26 NOTE — ED Notes (Signed)
Ordered Breakfast Tray  

## 2017-12-27 NOTE — ED Notes (Signed)
Snack and drink given. Pt did not eat any of his dinner tray. Cooperative and pleasant.

## 2017-12-27 NOTE — Progress Notes (Signed)
CSW received call from pt's mother asking to speak with pt. CSW explained to mother that CSW was unsure as if pt could have calls at this time but would take pt's mother number and provide it to RN so that when pt can have calls, pt can contact mother. At this time there are no further CSW needs. CSW signing off.    Claude MangesKierra S. Bronson Bressman, MSW, LCSW-A Emergency Department Clinical Social Worker [phone]

## 2017-12-27 NOTE — ED Notes (Signed)
Breakfast tray ordered 

## 2017-12-27 NOTE — ED Notes (Signed)
Meal orderd placed.

## 2017-12-27 NOTE — BHH Counselor (Signed)
Reassessment- Pt denies SI/HI and AVH.  Pt is not oriented. Pt was not oriented to day/time/reason he is in the hospital.  TTS will continue to seek placement.   Wolfgang PhoenixBrandi Devany Aja, [facility] Triage Specialist

## 2017-12-27 NOTE — ED Notes (Signed)
Pt oob to bathroom with steady gait. 

## 2017-12-28 ENCOUNTER — Other Ambulatory Visit: Payer: Self-pay

## 2017-12-28 NOTE — ED Notes (Signed)
Pt sitting up in bed watching tv. Asked RN "When do you think I can leave?" RN advised when bed placement has been assigned. Verbalized understanding.

## 2017-12-28 NOTE — ED Notes (Signed)
Only took 1 bite of sausage then returned to sleeping.
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
## 2017-12-28 NOTE — ED Notes (Signed)
Pt wakes briefly then returns to sleeping. Offered pt to shower - states will later.

## 2017-12-28 NOTE — ED Notes (Signed)
Regular Diet ordered for Dinner. 

## 2017-12-28 NOTE — BH Assessment (Signed)
Per instructions from the [facility]," Writer forwarded patient's information to [facility] Attending Physician (Dr. Jennet MaduroPucilowska) and [facility] Charge Nurse Dedra Skeens(Gwen F.). Per Dr. Jennet MaduroPucilowska and Charge nurse, their are no appropriate beds at this time. Writer informed [facility] Staff Audiological scientist(Jolan, Disposition Child psychotherapistocial Worker).

## 2017-12-28 NOTE — ED Notes (Signed)
Pt drinking soda so temp - 96.3.

## 2017-12-29 NOTE — ED Notes (Signed)
Patient was given a A snack and Drink and A Regular Diet was ordered for Lunch.

## 2017-12-29 NOTE — ED Notes (Signed)
TTS at bedside. 

## 2017-12-29 NOTE — ED Notes (Signed)
Pt attempted to contact mother-- phone is turned off per pt until the 18th. Mother's name is Marge DuncansSara Henderson-- she works at [employer]-- will attempt to find a number for her work.

## 2017-12-29 NOTE — Progress Notes (Signed)
Patient does not meet criteria for inpatient treatment per [facility], NP.    CSW was asked to attempt to contact pt's mother to determine if she was willing to take the pt back into her home.  [facility] Staff had earlier attempted to contact family with no success or ability to leave a HIPAA compliant message.  CSW looked through patient's chart, including ed visits and admissions dating back to 2013 to see if there was a alternative phone contact for the patient's mother, or anyone in his family, so that we could get collateral from them and to see if the pt's mother had concerns about his coming back to the home.  After attempting to research this for several hours, CSW called and spoke to EDP, Dr. Lynelle DoctorKnapp, letting him know that patient was psychiatrically cleared but that we were unable to contact his family.  CSW suggested that Dr. Lynelle DoctorKnapp consider discharging the patient, although we have been unable to collect collateral.  CSW had earlier asked patient's RN, Victorino DikeJennifer, to see if a cab voucher could be arranged for pt and she agreed to do so.  Dr. Lynelle DoctorKnapp expressed understanding that patient was psych cleared.  Timmothy EulerJean T. Kaylyn LimSutter, MSW, LCSWA Disposition Clinical Social Work [phone] (cell) [phone] (office)

## 2017-12-29 NOTE — ED Notes (Signed)
Provider completed IVC release paperwork, RN faxed to Magistrate's office.  Pt made aware and thanked Charity fundraiserN.

## 2017-12-29 NOTE — Discharge Instructions (Signed)
Continue your current medications, follow-up with your mental health doctor

## 2017-12-29 NOTE — ED Provider Notes (Signed)
Notified by TTS that pt is clear for discharge.  Staff are attempting to contact his mother   David [name], David Milligan, MD 12/29/17 2026

## 2017-12-29 NOTE — ED Notes (Signed)
Consulting civil engineerCharge RN approved a cab voucher home

## 2017-12-29 NOTE — ED Notes (Signed)
Pt is sleeping at present.

## 2017-12-29 NOTE — ED Notes (Signed)
[facility] regarding inability to reach mother-- no one to discharge pt to, no way for him to get home.

## 2017-12-29 NOTE — [facility] Counselor (Signed)
Re-assessment:   Patient denies SI, HI and AVH. Patient report he feels better since he has been on his medicine. Patient denies having a reaction to the medication. Patient [facility].   Collateral:  Anthonette LegatoSarah Henderson (mother) - TTS accepted to contact patient's mother. Phone rang, no answer, no answer machine. TTS unable to leave a message.   Juliann MuleYolanda Thurston (sister) - TTS accepted to contact patient's sister. A HIPPA compliant message left requesting a call back.

## 2017-12-29 NOTE — ED Notes (Signed)
ORDERED DIET TRAY FOR PT  

## 2018-05-27 ENCOUNTER — Inpatient Hospital Stay (HOSPITAL_COMMUNITY)
Admission: AD | Admit: 2018-05-27 | Payer: No Typology Code available for payment source | Source: Intra-hospital | Admitting: Psychiatry

## 2018-05-27 ENCOUNTER — Emergency Department (HOSPITAL_COMMUNITY)
Admission: EM | Admit: 2018-05-27 | Discharge: 2018-05-31 | Disposition: A | Discharge status: Home / Self Care | Payer: Self-pay | Attending: Emergency Medicine | Admitting: Emergency Medicine

## 2018-05-27 ENCOUNTER — Encounter (HOSPITAL_COMMUNITY): Payer: Self-pay | Admitting: Emergency Medicine

## 2018-05-27 DIAGNOSIS — Z046 Encounter for general psychiatric examination, requested by authority: Secondary | ICD-10-CM | POA: Insufficient documentation

## 2018-05-27 DIAGNOSIS — Z7289 Other problems related to lifestyle: Secondary | ICD-10-CM | POA: Insufficient documentation

## 2018-05-27 DIAGNOSIS — Z79899 Other long term (current) drug therapy: Secondary | ICD-10-CM | POA: Insufficient documentation

## 2018-05-27 DIAGNOSIS — F17200 Nicotine dependence, unspecified, uncomplicated: Secondary | ICD-10-CM | POA: Insufficient documentation

## 2018-05-27 DIAGNOSIS — F250 Schizoaffective disorder, bipolar type: Secondary | ICD-10-CM | POA: Insufficient documentation

## 2018-05-27 DIAGNOSIS — Y999 Unspecified external cause status: Secondary | ICD-10-CM | POA: Insufficient documentation

## 2018-05-27 DIAGNOSIS — Y929 Unspecified place or not applicable: Secondary | ICD-10-CM | POA: Insufficient documentation

## 2018-05-27 DIAGNOSIS — Y9389 Activity, other specified: Secondary | ICD-10-CM | POA: Insufficient documentation

## 2018-05-27 DIAGNOSIS — Y289XXA Contact with unspecified sharp object, undetermined intent, initial encounter: Secondary | ICD-10-CM | POA: Insufficient documentation

## 2018-05-27 DIAGNOSIS — S66326A Laceration of extensor muscle, fascia and tendon of right little finger at wrist and hand level, initial encounter: Secondary | ICD-10-CM | POA: Insufficient documentation

## 2018-05-27 DIAGNOSIS — IMO0002 Reserved for concepts with insufficient information to code with codable children: Secondary | ICD-10-CM

## 2018-05-27 LAB — COMPREHENSIVE METABOLIC PANEL
ALK PHOS: 53 U/L (ref 38–126)
ALT: 13 U/L — AB (ref 17–63)
AST: 18 U/L (ref 15–41)
Albumin: 4.9 g/dL (ref 3.5–5.0)
Anion gap: 13 (ref 5–15)
BILIRUBIN TOTAL: 0.9 mg/dL (ref 0.3–1.2)
BUN: 10 mg/dL (ref 6–20)
CALCIUM: 10.1 mg/dL (ref 8.9–10.3)
CO2: 23 mmol/L (ref 22–32)
CREATININE: 1.11 mg/dL (ref 0.61–1.24)
Chloride: 101 mmol/L (ref 101–111)
Glucose, Bld: 83 mg/dL (ref 65–99)
Potassium: 4.7 mmol/L (ref 3.5–5.1)
SODIUM: 137 mmol/L (ref 135–145)
Total Protein: 8.4 g/dL — ABNORMAL HIGH (ref 6.5–8.1)

## 2018-05-27 LAB — CBC WITH DIFFERENTIAL/PLATELET
ABS IMMATURE GRANULOCYTES: 0 10*3/uL (ref 0.0–0.1)
Basophils Absolute: 0 10*3/uL (ref 0.0–0.1)
Basophils Relative: 1 %
Eosinophils Absolute: 0.1 10*3/uL (ref 0.0–0.7)
Eosinophils Relative: 2 %
HEMATOCRIT: 52.3 % — AB (ref 39.0–52.0)
HEMOGLOBIN: 17 g/dL (ref 13.0–17.0)
Immature Granulocytes: 0 %
LYMPHS PCT: 23 %
Lymphs Abs: 1.7 10*3/uL (ref 0.7–4.0)
MCH: 27.6 pg (ref 26.0–34.0)
MCHC: 32.5 g/dL (ref 30.0–36.0)
MCV: 84.9 fL (ref 78.0–100.0)
MONO ABS: 0.7 10*3/uL (ref 0.1–1.0)
Monocytes Relative: 9 %
NEUTROS ABS: 4.8 10*3/uL (ref 1.7–7.7)
Neutrophils Relative %: 65 %
Platelets: 303 10*3/uL (ref 150–400)
RBC: 6.16 MIL/uL — ABNORMAL HIGH (ref 4.22–5.81)
RDW: 11.6 % (ref 11.5–15.5)
WBC: 7.4 10*3/uL (ref 4.0–10.5)

## 2018-05-27 LAB — ETHANOL: Alcohol, Ethyl (B): <10 mg/dL (ref <10)

## 2018-05-27 MED ORDER — ACETAMINOPHEN 325 MG PO TABS: 650.0000 mg | ORAL_TABLET | ORAL | Status: DC | PRN

## 2018-05-27 MED ORDER — DIVALPROEX SODIUM ER 500 MG PO TB24
500.0000 mg | ORAL_TABLET | Freq: Every day | ORAL | Status: DC
  Filled 2018-05-27: qty 1

## 2018-05-27 MED ORDER — HALOPERIDOL 1 MG PO TABS
5.0000 mg | ORAL_TABLET | Freq: Two times a day (BID) | ORAL | Status: DC
  Administered 2018-05-28: 5 mg via ORAL
  Filled 2018-05-27: qty 5

## 2018-05-27 MED ORDER — ZIPRASIDONE MESYLATE 20 MG IM SOLR
20.0000 mg | INTRAMUSCULAR | Status: AC | PRN
  Administered 2018-05-27: 20 mg via INTRAMUSCULAR
  Filled 2018-05-27: qty 20

## 2018-05-27 MED ORDER — LIDOCAINE HCL 2 % IJ SOLN
10.0000 mL | Freq: Once | INTRAMUSCULAR | Status: AC
  Administered 2018-05-27: 200 mg via INTRADERMAL
  Filled 2018-05-27: qty 20

## 2018-05-27 MED ORDER — OLANZAPINE 5 MG PO TBDP
5.0000 mg | ORAL_TABLET | Freq: Three times a day (TID) | ORAL | Status: DC | PRN
  Administered 2018-05-28: 5 mg via ORAL
  Filled 2018-05-27 (×2): qty 1

## 2018-05-27 MED ORDER — ZOLPIDEM TARTRATE 5 MG PO TABS
5.0000 mg | ORAL_TABLET | Freq: Every evening | ORAL | Status: DC | PRN
Start: 2018-05-27 — End: 2018-05-31
  Administered 2018-05-28 – 2018-05-29 (×2): 5 mg via ORAL
  Filled 2018-05-27 (×2): qty 1

## 2018-05-27 MED ORDER — ONDANSETRON HCL 4 MG PO TABS
4.0000 mg | ORAL_TABLET | Freq: Three times a day (TID) | ORAL | Status: DC | PRN
  Administered 2018-05-28: 4 mg via ORAL
  Filled 2018-05-27: qty 1

## 2018-05-27 MED ORDER — NICOTINE 21 MG/24HR TD PT24
21.0000 mg | MEDICATED_PATCH | Freq: Every day | TRANSDERMAL | Status: DC
  Filled 2018-05-27: qty 1

## 2018-05-27 MED ORDER — LORAZEPAM 1 MG PO TABS
1.0000 mg | ORAL_TABLET | ORAL | Status: AC | PRN
  Administered 2018-05-27: 1 mg via ORAL
  Filled 2018-05-27: qty 1

## 2018-05-27 MED ORDER — TRAZODONE HCL 50 MG PO TABS: 50.0000 mg | ORAL_TABLET | Freq: Every evening | ORAL | Status: DC | PRN

## 2018-05-27 MED ORDER — BENZTROPINE MESYLATE 1 MG PO TABS
0.5000 mg | ORAL_TABLET | Freq: Every day | ORAL | Status: DC
  Administered 2018-05-28: 0.5 mg via ORAL
  Filled 2018-05-27: qty 1

## 2018-05-27 MED ORDER — ALUM & MAG HYDROXIDE-SIMETH 200-200-20 MG/5ML PO SUSP: 30.0000 mL | Freq: Four times a day (QID) | ORAL | Status: DC | PRN

## 2018-05-27 NOTE — ED Notes (Signed)
Pt is not corruptive at this time.  Will attempt EKG when pt has calmed down.

## 2018-05-27 NOTE — ED Triage Notes (Signed)
Patient complains of laceration to right little finger, self inflicted with a knife. Patient's family states they woke up from a nap and patient was apparently trying to stab himself in the hand. Patient denies intent to harm himself, denies suicidal ideations. Patient cooperative with questions related to the injury itself but states "I don't know" when asked questions about intent.

## 2018-05-27 NOTE — ED Notes (Signed)
Pt made verbal agreement that he if he would take the Ativan PO then he would could have one of the restraints off at 2030 and then the other one would come off at 2045.

## 2018-05-27 NOTE — ED Provider Notes (Signed)
TTS recommends inpatient tx. Will IVC   David Switala Marie, PA-C 06/1[name]4/19 1641    David [name], Scott, MD 05/27/18 [phone]

## 2018-05-27 NOTE — ED Notes (Signed)
Pt in hallway trying to leave. Pt tried to walk through Pod B and leave the ED. Security and [employer] are with Pt.  Pt came back to room.  Pt offered Ativan or Geodon to help with agitation. Pt refused PRN ativan and consented to Geodon.  Pt refusing to get into paper scrubs.  Pt has been IVC'd. RN will continue to monitor Pt. RN will attempt to put Pt in paper scrubs.

## 2018-05-27 NOTE — ED Notes (Signed)
Pt still alert and awake.  EKG performed on Pt. RN consulted with Pharmacy and will give 1mg  of Ativan to Pt since he is still awake.  Pt will have vitals monitored. 15 minutes after PO Ativan and Pt still cooperative then restraints will taken off 1 arm at a time. Vitals still WDL. Pt's HR is 91.

## 2018-05-27 NOTE — ED Notes (Signed)
Pt placed in 4 point restraints.

## 2018-05-27 NOTE — ED Notes (Signed)
Unable to get urine due to pts state

## 2018-05-27 NOTE — ED Notes (Signed)
Pt refused to give urine sample

## 2018-05-27 NOTE — ED Provider Notes (Signed)
[facility] EMERGENCY DEPARTMENT Provider Note   CSN: [id number] Arrival date & time: 05/27/18  1249     History   Chief Complaint Chief Complaint  Patient presents with  . Finger Injury    HPI David [name] is a 22 y.o. male.  HPI   22 year old male with history of bipolar, psychosis, mood instability with brought in by parent for evaluation of self injury.  Patient cut his right little finger earlier today.  He would not tell me any details as to why he did it.  He denies self-harm.  He denies SI/HI, or hallucination.  He denies self-medicating with anything.  She denies depression.  He denies any significant pain.  He is left-hand dominant.  He is up-to-date with tetanus.  Mom report history of self-injurious behavior in the past.  He has been compliant with his medication.  Past Medical History:  Diagnosis Date  . Psychosis (HCC)   . Suicidal ideation     Patient Active Problem List   Diagnosis Date Noted  . Bipolar I disorder, most recent episode (or current) manic (HCC) 10/14/2017  . Psychosis (HCC) 10/13/2017  . 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 Avulsion of left ear 08/24/2016  . Multiple fractures of cervical spine (HCC) 08/24/2016  . Mesenteric hematoma 08/24/2016  . Serosal tear of colon 08/24/2016  . Multiple abrasions 08/24/2016  . Open fracture of right tibia and fibula 08/24/2016  . Multiple closed tarsal fractures of left foot 08/24/2016  . Acute blood loss anemia 08/24/2016  . Acute urinary retention 08/24/2016  . Blunt trauma 08/22/2016  . Aggressive behavior     Past Surgical History:  Procedure Laterality Date  . APPLICATION OF WOUND VAC Right 08/23/2016   Procedure: WOUND VAC EXCHANGE;  Surgeon: Tarry Kos, MD;  Location: [facility] OR;  Service: Orthopedics;  Laterality: Right;  . EXTERNAL FIXATION LEG  08/22/2016   Procedure: POSSIBLE EXTERNAL FIXATION LEG;  Surgeon: Gaynelle Adu, MD;  Location: [facility] OR;  Service: General;;  . EXTERNAL FIXATION REMOVAL Right 08/23/2016   Procedure: REMOVAL EXTERNAL FIXATION LEG;  Surgeon: Tarry Kos, MD;  Location: [facility] OR;  Service: Orthopedics;  Laterality: Right;  . I&D EXTREMITY Right 08/22/2016   Procedure: IRRIGATION AND DEBRIDEMENT RIGHT LEG AND CASTING;  Surgeon: Gaynelle Adu, MD;  Location: [facility] OR;  Service: General;  Laterality: Right;  . LAPAROSCOPY N/A 08/22/2016   Procedure: LAPAROSCOPY DIAGNOSTIC;  Surgeon: Gaynelle Adu, MD;  Location: [facility] OR;  Service: General;  Laterality: N/A;  . LAPAROTOMY N/A 08/22/2016   Procedure: Exploratory LAPAROTOMY repair of right colon serosal tear - pre-op blunt abdominal trauma;  Surgeon: Gaynelle Adu, MD;  Location: [facility] OR;  Service: General;  Laterality: N/A;  . OTOPLASATY Left 08/22/2016   Procedure: REPAIR OF LEFT EAR;  Surgeon: Gaynelle Adu, MD;  Location: [facility] OR;  Service: General;  Laterality: Left;  . TIBIA IM NAIL INSERTION Right 08/23/2016   Procedure: INTRAMEDULLARY (IM) NAIL TIBIAL;  Surgeon: Tarry Kos, MD;  Location: [facility] OR;  Service: Orthopedics;  Laterality: Right;        Home Medications    Prior to Admission medications   Medication Sig Start Date End Date Taking? Authorizing Provider  benztropine (COGENTIN) 0.5 MG tablet Take 1 tablet (0.5 mg total) daily by mouth. For prevention of drug induced tremors 10/19/17   Armandina Stammer I, NP  divalproex (DEPAKOTE ER) 500 MG 24 hr tablet Take  1 tablet (500 mg total) at bedtime by mouth. For mood stabilization 10/18/17   Armandina StammerNwoko, Agnes I, NP  haloperidol (HALDOL) 0.5 MG tablet Take 5 tablets (2.5 mg total) 2 (two) times daily by mouth. For mood control 10/18/17   Armandina StammerNwoko, Agnes I, NP  traZODone (DESYREL) 50 MG tablet Take 1 tablet (50 mg) by mouth at bedtime: For sleep 10/18/17   Sanjuana KavaNwoko, Agnes I, NP    Family History No family history on file.  Social History Social History   Tobacco Use  . Smoking status: Current Every Day Smoker     Packs/day: 1.00  . Smokeless tobacco: Never Used  Substance Use Topics  . Alcohol use: Yes    Comment: last  drank beer 12-24-17  . Drug use: No     Allergies   Patient has no known allergies.   Review of Systems Review of Systems  Unable to perform ROS: Psychiatric disorder     Physical Exam Updated Vital Signs BP 121/86 (BP Location: Right Arm)   Pulse (!) 111   Temp 99.1 F (37.3 C) (Oral)   SpO2 92%   Physical Exam  Constitutional: He appears well-developed and well-nourished. No distress.  HENT:  Head: Atraumatic.  Eyes: Conjunctivae are normal.  Neck: Neck supple.  Musculoskeletal: He exhibits tenderness (Right little finger: Deep laceration noted to the dorsum of the finger overlying middle phalanx with tendon involvement.  The patient unable to extend distal phalanx.  Brisk cap refill.  No bony involvement.).  Neurological: He is alert.  Skin: No rash noted.  Psychiatric: His speech is normal. His affect is blunt. He is withdrawn. He expresses no homicidal and no suicidal ideation.  Nursing note and vitals reviewed.    ED Treatments / Results  Labs (all labs ordered are listed, but only abnormal results are displayed) Labs Reviewed  CBC WITH DIFFERENTIAL/PLATELET - Abnormal; Notable for the following components:      Result Value   RBC 6.16 (*)    HCT 52.3 (*)    All other components within normal limits  COMPREHENSIVE METABOLIC PANEL - Abnormal; Notable for the following components:   Total Protein 8.4 (*)    ALT 13 (*)    All other components within normal limits  ETHANOL  RAPID URINE DRUG SCREEN, HOSP PERFORMED    EKG None  Radiology No results found.  Procedures .Marland Kitchen.Laceration Repair Date/Time: 05/27/2018 3:51 PM Performed by: Fayrene Helperran, Arielys Wandersee, PA-C Authorized by: Fayrene Helperran, Aloura Matsuoka, PA-C   Consent:    Consent obtained:  Verbal   Consent given by:  Patient   Risks discussed:  Need for additional repair and tendon damage   Alternatives discussed:   Referral Anesthesia (see MAR for exact dosages):    Anesthesia method:  Nerve block   Block location:  Digital nerve block   Block needle gauge:  25 G   Block anesthetic:  Lidocaine 2% w/o epi   Block technique:  Digital nerve block   Block injection procedure:  Anatomic landmarks identified   Block outcome:  Anesthesia achieved Laceration details:    Location:  Finger   Finger location:  L index finger   Length (cm):  1   Depth (mm):  4 Repair type:    Repair type:  Intermediate Pre-procedure details:    Preparation:  Patient was prepped and draped in usual sterile fashion Exploration:    Hemostasis achieved with:  Direct pressure   Wound exploration: wound explored through full range of motion and entire depth  of wound probed and visualized     Wound extent: tendon damage     Wound extent: no foreign bodies/material noted and no underlying fracture noted     Tendon damage location:  Upper extremity   Upper extremity tendon damage location:  Finger extensor   Tendon damage extent:  Complete transection   Tendon repair plan:  Refer for evaluation   Contaminated: no   Treatment:    Area cleansed with:  Betadine and saline   Amount of cleaning:  Extensive   Irrigation solution:  Sterile saline   Irrigation volume:  500   Irrigation method:  Pressure wash   Visualized foreign bodies/material removed: no   Skin repair:    Repair method:  Sutures   Suture size:  5-0   Suture material:  Prolene   Suture technique:  Simple interrupted   Number of sutures:  3 Approximation:    Approximation:  Close Post-procedure details:    Dressing:  Non-adherent dressing   Patient tolerance of procedure:  Tolerated well, no immediate complications   (including critical care time)  Medications Ordered in ED Medications  lidocaine (XYLOCAINE) 2 % (with pres) injection 200 mg (has no administration in time range)     Initial Impression / Assessment and Plan / ED Course  I have reviewed  the triage vital signs and the nursing notes.  Pertinent labs & imaging results that were available during my care of the patient were reviewed by me and considered in my medical decision making (see chart for details).     BP 121/86 (BP Location: Right Arm)   Pulse (!) 111   Temp 99.1 F (37.3 C) (Oral)   SpO2 92%    Final Clinical Impressions(s) / ED Diagnoses   Final diagnoses:  Laceration of extensor muscle, fascia and tendon of right little finger at wrist and hand level, initial encounter  Self-inflicted injury    ED Discharge Orders    None     2:22 PM Patient with self-inflicted wound to his right little finger.  He had a horizontal laceration to the dorsum of his middle phalanx.  He is unable to fully extend his distal phalanx.  suspect laceration of the extensor tendon.  Will consult hand specialist.  He is up-to-date with tetanus.  Given self-injurious behavior, history of bipolar and psychosis in the setting of patient not giving me too much information, will perform medical clearance and will consult TTS for further management.  Family member at bedside agrees with plan.  2:57 PM Appreciate consultation from hand specialist Dr. Izora Ribas who recommend thorough cleansing of the wound, apply suture repair and put finger in a splint.  Pt can call and follow up in 1 week for further management.    4:02 PM Currently awaits UDS as well as recommendation from psych for further management.  Pt is medically cleared.  He will need to f/u with hand specialist Dr. Izora Ribas in 1 week.  Care discussed with oncoming provider.    Fayrene Helper, PA-C 05/27/18 1604    Arby Barrette, MD 05/27/18 [phone]

## 2018-05-27 NOTE — ED Notes (Signed)
Pt has been taken out of Violent Restraints.  Pt has allowed RN and staff to place him in paper Scrubs.  Pt did not wish to sign inventory sheet with this RN so 2nd RN Ryland signed to confirm belongings. Pt is still in boxers and has a splint w/ contains metal on injured finger.  Pt has sitter at th bedside. Will continue to monitor.

## 2018-05-27 NOTE — Progress Notes (Signed)
Orthopedic Tech Progress Note Patient Details:  David [name] September 27, 1996 [id number]  Ortho Devices Type of Ortho Device: Finger splint Ortho Device/Splint Location: RUE pinky Ortho Device/Splint Interventions: Ordered, Application   Post Interventions Patient Tolerated: Well Instructions Provided: Care of device   David [name], David [name] 05/27/2018, 4:04 PM
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
## 2018-05-27 NOTE — ED Notes (Signed)
Pt asking for Restraints to be taken off.  Pt states he just wants to go to sleep.  RN told Pt that he can have ankle restraints off. RN will reassess in 15 minutes. RN consulted with EPD and Pharmacy if Pt can be given Ativan even after Geodon. RN told if Pt is still agitated after receiving the Geodon then he would be able receive the Ativan as well.

## 2018-05-28 ENCOUNTER — Other Ambulatory Visit: Payer: Self-pay

## 2018-05-28 DIAGNOSIS — F250 Schizoaffective disorder, bipolar type: Secondary | ICD-10-CM | POA: Diagnosis present

## 2018-05-28 LAB — RAPID URINE DRUG SCREEN, HOSP PERFORMED
Amphetamines: NOT DETECTED
BENZODIAZEPINES: NOT DETECTED
COCAINE: NOT DETECTED
Opiates: NOT DETECTED
TETRAHYDROCANNABINOL: NOT DETECTED

## 2018-05-28 MED ORDER — RISPERIDONE 1 MG PO TABS
1.0000 mg | ORAL_TABLET | Freq: Two times a day (BID) | ORAL | Status: DC
  Administered 2018-05-28 – 2018-05-31 (×6): 1 mg via ORAL
  Filled 2018-05-28 (×6): qty 1

## 2018-05-28 MED ORDER — OLANZAPINE 5 MG PO TBDP
10.0000 mg | ORAL_TABLET | Freq: Three times a day (TID) | ORAL | Status: DC | PRN
  Administered 2018-05-29 – 2018-05-30 (×2): 10 mg via ORAL
  Filled 2018-05-28 (×3): qty 2

## 2018-05-28 MED ORDER — DIVALPROEX SODIUM ER 500 MG PO TB24
500.0000 mg | ORAL_TABLET | Freq: Two times a day (BID) | ORAL | Status: DC
  Administered 2018-05-28 – 2018-05-31 (×6): 500 mg via ORAL
  Filled 2018-05-28 (×6): qty 1

## 2018-05-28 NOTE — ED Triage Notes (Signed)
Pt had crackers ,peanut butter ,coke and an apple to eat  When he woke up. .Marland Kitchen

## 2018-05-28 NOTE — ED Notes (Signed)
Patient using phone at this time.

## 2018-05-28 NOTE — ED Notes (Addendum)
Resting quietly this shift with no distress noted or complaints voiced.

## 2018-05-28 NOTE — ED Triage Notes (Signed)
Pt's Mother requesting [facility] to call her. Pt's mother also reported she needs the [facility]  To fill out papers for her job.

## 2018-05-28 NOTE — ED Triage Notes (Signed)
Pt awake and pacing back and forth from room to bathroom. Pt at desk calling his mother.

## 2018-05-28 NOTE — Progress Notes (Addendum)
Patient meets criteria for inpatient treatment. CSW faxed referrals to the following [facility] for review. [address], [name], [place], [address] [place], [address] [facility], [place], [facility], [facility], [place], [place], [address], [place], [address], [place], [place], [facility], [facility], [facility], [facility], [facility], [facility], [facility], and [facility].   TTS will continue to seek bed placement.     Moss McKy-sha Abagayle Klutts, MSW, LCSW, LCAS 05/28/2018 8:13 AM

## 2018-05-28 NOTE — ED Notes (Signed)
Regular Diet was ordered for Dinner. 

## 2018-05-28 NOTE — ED Triage Notes (Signed)
[facility] to [facility] and reported Mother's request for information . [facility].

## 2018-05-28 NOTE — ED Triage Notes (Signed)
Pt eating lunch

## 2018-05-28 NOTE — ED Triage Notes (Signed)
Pt walking rapidly from room to bathroom.

## 2018-05-28 NOTE — ED Triage Notes (Signed)
Pt reported he wanted  Another coke and then changed to request OJ. Pt instructed he could not have coke after his OJ. PT verbalizes he understands he will not be given a coke after the OJ.

## 2018-05-28 NOTE — ED Triage Notes (Signed)
TTS done 05-28-18

## 2018-05-28 NOTE — ED Notes (Signed)
Regular Diet was ordered for Lunch. 

## 2018-05-28 NOTE — BH Assessment (Signed)
[facility] Assessment Progress Note       Mother wants to be notified when patient is placed. Please call Anthonette LegatoSarah Henderson @ [phone]

## 2018-05-28 NOTE — BH Assessment (Addendum)
Reassessment note- Pt presents pleasant and cooperative for reassessment. His mood is depressed and his affect is flat.  Pt denied knowing which hospital he was currently in; he stated it was "2019" after quietly saying "2016, 2018". Pt stated he did not know what season it is right now. Pt reports his mood is about the same as it was when he was admitted. He denies SI & HI. Pt reports he was angry when he cut his finger, but offered no reason.  Pt agreeable to cooperating with follow-up inpatient treatment to feel better. Pt had no questions other than who his nurse was today. Pt continues to meet inpt criteria.

## 2018-05-28 NOTE — ED Notes (Signed)
Patient was given a snack and drink. 

## 2018-05-29 NOTE — ED Notes (Signed)
Patient sleeping at this time. A snack will be offered when pt. Wakeup.

## 2018-05-29 NOTE — ED Notes (Signed)
Patient using phone for 2nd time.

## 2018-05-29 NOTE — ED Triage Notes (Signed)
Pt reported he was going home if his mother visits.

## 2018-05-29 NOTE — ED Notes (Signed)
Patient made his first phone call at 805 am.

## 2018-05-29 NOTE — ED Triage Notes (Signed)
After Pt finished TTS he called Mother to tell her to pick him up.

## 2018-05-29 NOTE — ED Notes (Signed)
Regular Dinner order has been ordered. 

## 2018-05-29 NOTE — ED Triage Notes (Signed)
TTS done 05-29-18

## 2018-05-29 NOTE — ED Triage Notes (Signed)
Pt agitated while talking with his mother. Pt requested this Clinical research associatewriter to talk with mother. Pt's Mother reported to this writer that [name] would continue calling her ,because that is what he does.

## 2018-05-29 NOTE — ED Triage Notes (Signed)
PT  At desk calling mother to please pick him up. Pt agitated at desk because he wants Mother to pick him up.

## 2018-05-29 NOTE — ED Notes (Signed)
Regular Diet was ordered for Lunch. 
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
## 2018-05-29 NOTE — ED Notes (Signed)
Breakfast tray ordered 

## 2018-05-29 NOTE — ED Triage Notes (Signed)
PT returned to room

## 2018-05-29 NOTE — ED Triage Notes (Signed)
PT lunch meal delivered @ 1453

## 2018-05-29 NOTE — ED Triage Notes (Signed)
PT refuses snack at this time

## 2018-05-29 NOTE — ED Notes (Signed)
Patient refused a Air traffic controllerhower today.

## 2018-05-30 MED ORDER — STERILE WATER FOR INJECTION IJ SOLN
INTRAMUSCULAR | Status: AC
  Filled 2018-05-30: qty 10

## 2018-05-30 MED ORDER — ZIPRASIDONE MESYLATE 20 MG IM SOLR
10.0000 mg | Freq: Once | INTRAMUSCULAR | Status: AC
  Administered 2018-05-30: 10 mg via INTRAMUSCULAR
  Filled 2018-05-30: qty 20

## 2018-05-30 NOTE — ED Provider Notes (Signed)
Called to bedside after patient attempted to elope from department. He is calm on evaluation but tachycardic. Denies any current SI or HI but he has poor insight into recent events. Given concern for recurrent flight risk and recent suicidal thoughts he will be medicated to prevent recurrent elopement.   Tilden Fossaees, Charlcie Prisco, MD 05/30/18 2310

## 2018-05-30 NOTE — ED Notes (Signed)
Pt wanted to speak with mother. Allowed pt to call mother. Cooperative and pleasant. Mother wanted to ask some questions and this RN spoke with her. Told mother that pt will be re-evaled in the am and hopefully would know something then. Mother ask how pt was and if he was having any conversations with anyone. Told mother that pt has been fully cooperative with this RN, and that he was quiet but polite and would answer questions and talk when awake. Mother states that she will call tomorrow to see if there is any updates.

## 2018-05-30 NOTE — ED Notes (Signed)
Pt asked for snack and sandwich. Mal[place]urkey sandwich and drink given to pt. Pt did not eat dinner or pm snack due to sleeping

## 2018-05-30 NOTE — ED Notes (Signed)
Pt called mother, mother also spoke to [employer]

## 2018-05-30 NOTE — ED Notes (Signed)
Breakfast tray ordered 
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
## 2018-05-30 NOTE — Progress Notes (Signed)
Disposition CSW contacted by Psych ED Nurse, Magda PaganiniAudrey.  Pt ran from the facility and was intercepted by [employer].  Pt has orders to administer Geodon and may not be able to be reassessed until much later.  Timmothy EulerJean T. Kaylyn LimSutter, MSW, LCSWA Disposition Clinical Social Work [phone] (cell) [phone] (office)

## 2018-05-30 NOTE — ED Notes (Signed)
Pt given snack and drink 

## 2018-05-30 NOTE — ED Triage Notes (Signed)
Pt returned to desk to call mother. Pt encouraged to [facility] until [facility]  [facility] done. Pt returned to room.

## 2018-05-30 NOTE — BH Assessment (Signed)
Reassessment note- Pt reports he is feeling sleepy, but appears alert considering medication given after attempted elopement today. Pt did not engage in conversation regarding his unsuccessful attempt to leave the hospital.  He denies current SI, HI & AVH. Pt denies paranoia. Pt asked when he would be d/c from hospital. He was told inpt psychiatric care was recommended and tx on average was 3-5 days. Pt said "ok". Pt continues to meet inpt criteria.

## 2018-05-30 NOTE — ED Triage Notes (Signed)
TTS done 05/30/18

## 2018-05-30 NOTE — ED Triage Notes (Signed)
PT walked out of room to make TC. Pt instructed to return to room because he has made his 2 calls for the next 24 hrs.

## 2018-05-30 NOTE — ED Triage Notes (Addendum)
PT returned to desk asking to call Mother. Pt reminded he has 2 TC in 24 Hrs. Pt reminded he called and spoke with his mother this morning. Pt asked if he want to use his second TC now or wait till  [facility]. Pt then when could he go home.

## 2018-05-30 NOTE — ED Triage Notes (Signed)
PT at desk to call mother. Pt asking Mother to pick him up. Pt then asked when he could go home . Pt was told [facility] and [facility] can [facility].

## 2018-05-31 MED ORDER — RISPERIDONE 1 MG PO TABS: 1.0000 mg | ORAL_TABLET | Freq: Two times a day (BID) | ORAL | 0 refills | Status: DC

## 2018-05-31 MED ORDER — OLANZAPINE 10 MG PO TBDP: 10.0000 mg | ORAL_TABLET | Freq: Three times a day (TID) | ORAL | 0 refills | Status: DC | PRN

## 2018-05-31 MED ORDER — DIVALPROEX SODIUM ER 500 MG PO TB24: 500.0000 mg | ORAL_TABLET | Freq: Two times a day (BID) | ORAL | 0 refills | Status: DC

## 2018-05-31 NOTE — Discharge Instructions (Signed)
Schizoaffective Disorder Schizoaffective disorder (ScAD) is a mental illness. It causes symptoms that are a mixture of a psychotic disorder (schizophrenia) and a mood (affective) disorder. A psychotic disorder involves losing touch with reality. ScAD usually occurs in cycles. Periods of severe symptoms may be followed by periods of less severe symptoms or improvement. The illness affects men and women equally, but it usually appears at an earlier age (teenage or early adult years) in men. People who have family members with schizophrenia, bipolar disorder, or ScAD are at higher risk of developing ScAD. ScAD may interfere with personal relationships or normal daily activities. People with ScAD are at a higher risk for:  Job loss.  Social aloneness (isolation).  Health problems.  Anxiety.  Substance use disorders.  Suicide.  What are the causes? The exact cause of this condition is not known. What increases the risk? The following factors may make you more likely to develop this condition:  Problems during your mother's pregnancy and after your birth, such as: ? Your mother having the flu (influenza) during the second semester of the pregnancy. ? Exposure to drugs, alcohol, illnesses, or other poisons (toxins) before birth. ? Low birth weight.  A brain infection or viral infection.  Problems with brain structure or function.  Having family members with bipolar disorder, ScAD, or schizophrenia.  Substance abuse.  Having been diagnosed with a mental health condition in the past.  Being a victim of neglect or long-term (chronic) abuse.  What are the signs or symptoms? At any one time, people with ScAD may have psychotic symptoms only or have both psychotic and affective symptoms. Psychotic symptoms may include:  Hearing, seeing, or feeling things that are not there (hallucinations).  Having fixed, false beliefs (delusions). The delusions usually include being attacked,  harassed, or plotted against (paranoid delusions).  Speaking in a way that makes no sense to others (disorganized speech).  Confusing or odd behavior.  Loss of motivation for normal daily activities, such as self-care.  Withdrawal from social contacts (social isolation).  Lack of emotions.  Affective symptoms may include:  Symptoms similar to major depression, such as: ? Depressed mood. ? Loss of interest in activities that are usually pleasurable (anhedonia). ? Sleeping more or less than normal. ? Feeling worthless or excessively guilty. ? Lack of energy or motivation. ? Trouble concentrating. ? Eating more or less than usual. ? Thinking a lot about death or suicide.  Symptoms similar to bipolar mania. Bipolar mania refers to periods of severe elation, irritability, and high energy that are experienced by people who have bipolar disorder. These symptoms may include: ? Abnormally elevated or irritable mood. ? Abnormally increased energy or activity. ? More confidence than normal or feeling that you are able to do anything (grandiosity). ? Feeling rested after getting less sleep than normal. ? Being easily distracted. ? Talking more than usual or feeling pressure to keep talking. ? Feeling that your thoughts are racing. ? Engaging in high-risk activities.  How is this diagnosed? ScAD is diagnosed through an assessment by your health care provider. Your health care provider may refer you to a mental health specialist for evaluation. The mental health specialist:  Will observe and ask questions about your thoughts, behavior, mood, and ability to function in daily life.  May ask questions about your medical history and use of drugs, including prescription medicines. Certain medical conditions and substances can cause symptoms that resemble ScAD.  May do blood tests and imaging tests.  There are two   types of ScAD:  Depressive ScAD. This type is diagnosed when you have only  depressive symptoms.  Bipolar ScAD. This type is diagnosed if your affective symptoms are only manic or are a mixture of manic and depressive.  How is this treated? ScAD is usually a lifelong (chronic) illness that requires long-term treatment. Treatment may include:  Medicine. Different types of medicine are used to treat ScAD. The exact combination depends on the type and severity of your symptoms. ? Antipsychotic medicine may be used to control psychotic symptoms such as delusions, paranoia, and hallucinations. ? Mood stabilizers may be used to balance the highs and lows of bipolar manic mood swings. ? Antidepressant medicines may be used to treat depressive symptoms.  Counseling or talk therapy. Individual, group, or family counseling may be helpful in providing education, support, and guidance. Many people with ScAD also benefit from social skills and job skills (vocational) training.  A combination of medicine and counseling is usually best for managing the disorder over time. A procedure in which electricity is applied to the brain through the scalp (electroconvulsive therapy) may be used to treat people with severe manic symptoms who do not respond to medicine and counseling. Follow these instructions at home:  Take over-the-counter and prescription medicines only as told by your health care provider. Check with your health care provider before starting new medicines.  Surround yourself with people who care about you and can help you manage your condition.  Keep stress under control. Stress may make symptoms worse.  Avoid alcohol and drugs. They can affect how medicine works and make symptoms worse.  Keep all follow-up visits as told by your health care provider and counselor. This is important. Contact a health care provider if:  You are not able to take your medicines as prescribed.  Your symptoms get worse. Get help right away if:  You feel out of control.  You or others  notice warning signs of suicide, such as: ? Increased use of drugs or alcohol ? Expressing feelings of not having a purpose in life or feeling trapped, guilty, anxious, agitated, or hopeless. ? Withdrawing from friends and family. ? Showing uncontrolled anger, recklessness, and dramatic mood changes. ? Talking about suicide or searching for methods. If you ever feel like you may hurt yourself or others, or have thoughts about taking your own life, get help right away. You can go to your nearest emergency department or call:  Your local emergency services (911 in the [place]  A suicide crisis helpline, such as the National Suicide Prevention Lifeline at 1-800-273-8255. This is open 24 hours a day.  Summary  Schizoaffective disorder causes symptoms that are a mixture of a psychotic disorder and a mood disorder.  A combination of medicine and counseling is usually best for managing the disorder over time.  People who have schizoaffective disorder are at risk for suicide. Get help right away if you or someone else notices warning signs of suicide. This information is not intended to replace advice given to you by your health care provider. Make sure you discuss any questions you have with your health care provider. Document Released: 04/12/2007 Document Revised: 09/11/2016 Document Reviewed: 09/11/2016 Elsevier Interactive Patient Education  2018 [employer]  

## 2018-05-31 NOTE — ED Notes (Addendum)
Per psych NP patient to be discharged. Dr. Silverio LayYao, ED physician, came to see patient and discussed patient with NP. At that time Dr. Silverio LayYao signed the Notice of Committment Change paperwork, rescinding the IVC paperwork. Patient was informed that the discharge process would take 30-45 minutes and patient stated "That's OK". Patient went back into his room. While IVC rescinding paperwork was being hand-delivered to the fax machine, and while sitter was at bedside, patient exited the psych area and exited the building via the exit door near [address]. Patient was last seen running up the stairway adjacent to the EMS parking area. Patient showed no sights of injury when ascending stairs quickly and did not stop when asked to by ED department personnel. Charge nurse notified. Dr. Silverio LayYao notified and agreered that patient was cleared to be discharged.

## 2018-05-31 NOTE — [facility] Suicide Risk Assessment (Signed)
Suicide Risk Assessment  Discharge Assessment   [facility]   Principal Problem: Schizoaffective disorder, bipolar type [facility](HCC) Discharge Diagnoses:  Patient Active Problem List   Diagnosis Date Noted  . Schizoaffective disorder, bipolar type (HCC) [F25.0] 05/28/2018    Priority: High  . Bipolar I disorder, most recent episode (or current) manic (HCC) [F31.10] 10/14/2017  . Psychosis (HCC) [F29] 10/13/2017  . Avulsion of left ear [S01.302A] 08/24/2016  . Multiple fractures of cervical spine (HCC) [S12.9XXA] 08/24/2016  . Mesenteric hematoma [S36.892A] 08/24/2016  . Serosal tear of colon [S36.539A] 08/24/2016  . Multiple abrasions [T07.XXXA] 08/24/2016  . Open fracture of right tibia and fibula [S82.201B, S82.401B] 08/24/2016  . Multiple closed tarsal fractures of left foot [S92.202A] 08/24/2016  . Acute blood loss anemia [D62] 08/24/2016  . Acute urinary retention [R33.8] 08/24/2016  . Blunt trauma [T14.90XA] 08/22/2016  . Aggressive behavior [R46.89]     Total Time spent with patient: 45 minutes  Musculoskeletal: Strength & Muscle Tone: within normal limits Gait & Station: normal Patient leans: N/A  Psychiatric Specialty Exam:   Blood pressure 136/82, pulse 99, temperature 98.6 F (37 C), temperature source Oral, resp. rate 18, SpO2 98 %.There is no height or weight on file to calculate BMI.  General Appearance: Casual  Eye Contact::  Good  Speech:  Normal Rate409  Volume:  Normal  Mood:  Euthymic  Affect:  Blunt  Thought Process:  Coherent and Descriptions of Associations: Intact  Orientation:  Full (Time, Place, and Person)  Thought Content:  WDL and Logical  Suicidal Thoughts:  No  Homicidal Thoughts:  No  Memory:  Immediate;   Good Recent;   Good Remote;   Good  Judgement:  Fair  Insight:  Fair  Psychomotor Activity:  Normal  Concentration:  Good  Recall:  Good  Fund of Knowledge:Fair  Language: Good  Akathisia:  No  Handed:   Right  AIMS (if indicated):     Assets:  Housing Leisure Time Physical Health Resilience Social Support  Sleep:     Cognition: WNL  ADL's:  Intact   Mental Status Per Nursing Assessment::   On Admission:   psychosis with agitation  Demographic Factors:  Male and Adolescent or young adult  Loss Factors: NA  Historical Factors: NA  Risk Reduction Factors:   Sense of responsibility to family, Living with another person, especially a relative, Positive social support and Positive therapeutic relationship  Continued Clinical Symptoms:  nONE   Cognitive Features That Contribute To Risk:  None    Suicide Risk:  Minimal: No identifiable suicidal ideation.  Patients presenting with no risk factors but with morbid ruminations; may be classified as minimal risk based on the severity of the depressive symptoms    Plan Of Care/Follow-up recommendations:  Activity:  as tolerated Diet:  heart healthy diet  Nanine MeansLORD, Kimela Malstrom, NP 05/31/2018, 4:37 PM

## 2018-05-31 NOTE — Progress Notes (Signed)
22 yo patient who presented to the ED via IVC y his family after having hallucinations and having aggression.  Medications were started and adjusted.  David [name] stabilized and is at his baseline with no suicidal/homicidal ideations, hallucinations, or substance abuse issues.  Behaviors have been consisted with med compliance.  Stable for discharge, Dr. Lucianne MussKumar, psychiatric medical director has reviewed this client and wants him to be discharged.  [facility].  Nanine MeansJamison Youa Deloney, PMHNP

## 2018-05-31 NOTE — ED Provider Notes (Signed)
  Physical Exam  BP (!) 138/93 (BP Location: Right Arm)   Pulse 87   Temp 98.8 F (37.1 C) (Oral)   Resp 19   SpO2 99%   Physical Exam  ED Course/Procedures     Procedures  MDM  Patient was under IVC but cleared by psych. Told me that he is not suicidal or homicidal. Has stable hallucinations. Stable for discharge. I reversed IVC. He left before getting his discharge paperwork.    Charlynne PanderYao, Tino Ronan Hsienta, MD 05/31/18 [phone]

## 2018-05-31 NOTE — [facility] Counselor (Signed)
  Patient case was discussed during morning disposition meeting and the following was discussed; Shuvon Rankin, NP, will look at patient medications, making recommendations / adjustments as needed. Nanine MeansJamison Lord, NP, will see patient face/face for a psych consult between the hours of 4pm - 8pm. Nanine MeansJamison Lord, NP, will make recommendations concerning patient disposition for hospitalizations or discharge.

## 2018-05-31 NOTE — ED Notes (Signed)
Regular diet lunch tray ordered - w/ "no sharps" requested.

## 2018-05-31 NOTE — ED Notes (Addendum)
Patient awake and was asking about his discharge plan. [facility] and that we would get a follow-up from them at approx. 11am. Patient calm and stated he understood. Patient also states he has no plans to elope today.

## 2018-07-15 ENCOUNTER — Other Ambulatory Visit: Payer: Self-pay

## 2018-07-15 ENCOUNTER — Inpatient Hospital Stay (HOSPITAL_COMMUNITY)
Admission: AD | Admit: 2018-07-15 | Discharge: 2018-07-21 | DRG: 885 | Disposition: A | Discharge status: Home / Self Care | Payer: Medicaid Other | Source: Intra-hospital | Attending: Psychiatry | Admitting: Psychiatry

## 2018-07-15 ENCOUNTER — Emergency Department (HOSPITAL_COMMUNITY)
Admission: EM | Admit: 2018-07-15 | Discharge: 2018-07-15 | Disposition: A | Discharge status: Other Acute Inpatient Hospital | Payer: Medicaid Other | Attending: Emergency Medicine | Admitting: Emergency Medicine

## 2018-07-15 DIAGNOSIS — F5105 Insomnia due to other mental disorder: Secondary | ICD-10-CM | POA: Diagnosis present

## 2018-07-15 DIAGNOSIS — F250 Schizoaffective disorder, bipolar type: Secondary | ICD-10-CM | POA: Insufficient documentation

## 2018-07-15 DIAGNOSIS — F200 Paranoid schizophrenia: Principal | ICD-10-CM | POA: Diagnosis present

## 2018-07-15 DIAGNOSIS — F17200 Nicotine dependence, unspecified, uncomplicated: Secondary | ICD-10-CM | POA: Diagnosis not present

## 2018-07-15 DIAGNOSIS — G259 Extrapyramidal and movement disorder, unspecified: Secondary | ICD-10-CM | POA: Diagnosis present

## 2018-07-15 DIAGNOSIS — Z91128 Patient's intentional underdosing of medication regimen for other reason: Secondary | ICD-10-CM

## 2018-07-15 DIAGNOSIS — Y92009 Unspecified place in unspecified non-institutional (private) residence as the place of occurrence of the external cause: Secondary | ICD-10-CM | POA: Diagnosis not present

## 2018-07-15 DIAGNOSIS — Z9114 Patient's other noncompliance with medication regimen: Secondary | ICD-10-CM | POA: Diagnosis not present

## 2018-07-15 DIAGNOSIS — F17210 Nicotine dependence, cigarettes, uncomplicated: Secondary | ICD-10-CM | POA: Diagnosis present

## 2018-07-15 DIAGNOSIS — Z79899 Other long term (current) drug therapy: Secondary | ICD-10-CM | POA: Insufficient documentation

## 2018-07-15 DIAGNOSIS — R45851 Suicidal ideations: Secondary | ICD-10-CM | POA: Diagnosis not present

## 2018-07-15 DIAGNOSIS — T426X6A Underdosing of other antiepileptic and sedative-hypnotic drugs, initial encounter: Secondary | ICD-10-CM | POA: Diagnosis present

## 2018-07-15 DIAGNOSIS — Z915 Personal history of self-harm: Secondary | ICD-10-CM

## 2018-07-15 DIAGNOSIS — F29 Unspecified psychosis not due to a substance or known physiological condition: Secondary | ICD-10-CM | POA: Diagnosis present

## 2018-07-15 LAB — COMPREHENSIVE METABOLIC PANEL
ALBUMIN: 4.4 g/dL (ref 3.5–5.0)
ALT: 18 U/L (ref 0–44)
AST: 19 U/L (ref 15–41)
Alkaline Phosphatase: 56 U/L (ref 38–126)
Anion gap: 10 (ref 5–15)
BILIRUBIN TOTAL: 0.3 mg/dL (ref 0.3–1.2)
BUN: 14 mg/dL (ref 6–20)
CO2: 26 mmol/L (ref 22–32)
Calcium: 9.4 mg/dL (ref 8.9–10.3)
Chloride: 105 mmol/L (ref 98–111)
Creatinine, Ser: 1.06 mg/dL (ref 0.61–1.24)
GFR calc Af Amer: >60 mL/min (ref >60)
GLUCOSE: 110 mg/dL — AB (ref 70–99)
POTASSIUM: 4 mmol/L (ref 3.5–5.1)
Sodium: 141 mmol/L (ref 135–145)
TOTAL PROTEIN: 7.7 g/dL (ref 6.5–8.1)

## 2018-07-15 LAB — CBC
HEMATOCRIT: 44.2 % (ref 39.0–52.0)
Hemoglobin: 15 g/dL (ref 13.0–17.0)
MCH: 28.7 pg (ref 26.0–34.0)
MCHC: 33.9 g/dL (ref 30.0–36.0)
MCV: 84.5 fL (ref 78.0–100.0)
Platelets: 293 10*3/uL (ref 150–400)
RBC: 5.23 MIL/uL (ref 4.22–5.81)
RDW: 12.1 % (ref 11.5–15.5)
WBC: 7.3 10*3/uL (ref 4.0–10.5)

## 2018-07-15 LAB — ACETAMINOPHEN LEVEL: Acetaminophen (Tylenol), Serum: <10 ug/mL — ABNORMAL LOW (ref 10–30)

## 2018-07-15 LAB — ETHANOL: Alcohol, Ethyl (B): <10 mg/dL (ref <10)

## 2018-07-15 LAB — SALICYLATE LEVEL: Salicylate Lvl: <7 mg/dL (ref 2.8–30.0)

## 2018-07-15 MED ORDER — MAGNESIUM HYDROXIDE 400 MG/5ML PO SUSP: 30.0000 mL | Freq: Every day | ORAL | Status: DC | PRN

## 2018-07-15 MED ORDER — DIPHENHYDRAMINE HCL 50 MG/ML IJ SOLN: 50.0000 mg | Freq: Once | INTRAMUSCULAR | Status: DC | PRN

## 2018-07-15 MED ORDER — LORAZEPAM 2 MG/ML IJ SOLN: 2.0000 mg | Freq: Once | INTRAMUSCULAR | Status: DC | PRN

## 2018-07-15 MED ORDER — LORAZEPAM 1 MG PO TABS: 1.0000 mg | ORAL_TABLET | ORAL | Status: DC | PRN

## 2018-07-15 MED ORDER — DIVALPROEX SODIUM 500 MG PO DR TAB
500.0000 mg | DELAYED_RELEASE_TABLET | Freq: Every day | ORAL | Status: DC
  Administered 2018-07-16 – 2018-07-20 (×5): 500 mg via ORAL
  Filled 2018-07-15: qty 1
  Filled 2018-07-15: qty 7
  Filled 2018-07-15: qty 1
  Filled 2018-07-15: qty 7
  Filled 2018-07-15 (×5): qty 1

## 2018-07-15 MED ORDER — TRAZODONE HCL 50 MG PO TABS
50.0000 mg | ORAL_TABLET | Freq: Every evening | ORAL | Status: DC | PRN
  Administered 2018-07-16 – 2018-07-20 (×5): 50 mg via ORAL
  Filled 2018-07-15: qty 1
  Filled 2018-07-15: qty 7
  Filled 2018-07-15 (×2): qty 1

## 2018-07-15 MED ORDER — OLANZAPINE 5 MG PO TBDP: 5.0000 mg | ORAL_TABLET | Freq: Every day | ORAL | Status: DC

## 2018-07-15 MED ORDER — ACETAMINOPHEN 325 MG PO TABS: 650.0000 mg | ORAL_TABLET | Freq: Four times a day (QID) | ORAL | Status: DC | PRN

## 2018-07-15 MED ORDER — HYDROXYZINE HCL 25 MG PO TABS
25.0000 mg | ORAL_TABLET | Freq: Three times a day (TID) | ORAL | Status: DC | PRN
  Filled 2018-07-15: qty 1
  Filled 2018-07-15: qty 10

## 2018-07-15 MED ORDER — TRAZODONE HCL 50 MG PO TABS: 50.0000 mg | ORAL_TABLET | Freq: Every day | ORAL | Status: DC

## 2018-07-15 MED ORDER — DIVALPROEX SODIUM 500 MG PO DR TAB: 500.0000 mg | DELAYED_RELEASE_TABLET | Freq: Every day | ORAL | Status: DC

## 2018-07-15 MED ORDER — ZIPRASIDONE MESYLATE 20 MG IM SOLR: 20.0000 mg | Freq: Two times a day (BID) | INTRAMUSCULAR | Status: DC | PRN

## 2018-07-15 MED ORDER — HALOPERIDOL 2 MG PO TABS
2.5000 mg | ORAL_TABLET | Freq: Once | ORAL | Status: AC
  Administered 2018-07-15: 2.5 mg via ORAL
  Filled 2018-07-15: qty 1

## 2018-07-15 MED ORDER — ZIPRASIDONE MESYLATE 20 MG IM SOLR: 20.0000 mg | Freq: Once | INTRAMUSCULAR | Status: DC | PRN

## 2018-07-15 MED ORDER — ALUM & MAG HYDROXIDE-SIMETH 200-200-20 MG/5ML PO SUSP: 30.0000 mL | ORAL | Status: DC | PRN

## 2018-07-15 MED ORDER — OLANZAPINE 5 MG PO TBDP
5.0000 mg | ORAL_TABLET | ORAL | Status: AC
  Administered 2018-07-15: 5 mg via ORAL
  Filled 2018-07-15: qty 1

## 2018-07-15 MED ORDER — OLANZAPINE 5 MG PO TBDP: 5.0000 mg | ORAL_TABLET | Freq: Once | ORAL | Status: DC | PRN

## 2018-07-15 MED ORDER — BENZTROPINE MESYLATE 0.5 MG PO TABS
0.5000 mg | ORAL_TABLET | Freq: Every day | ORAL | Status: DC
  Administered 2018-07-16 – 2018-07-21 (×6): 0.5 mg via ORAL
  Filled 2018-07-15 (×2): qty 1
  Filled 2018-07-15: qty 7
  Filled 2018-07-15 (×2): qty 1
  Filled 2018-07-15: qty 7
  Filled 2018-07-15 (×4): qty 1

## 2018-07-15 MED ORDER — BENZTROPINE MESYLATE 0.5 MG PO TABS
0.5000 mg | ORAL_TABLET | Freq: Every day | ORAL | Status: DC
  Administered 2018-07-15: 0.5 mg via ORAL
  Filled 2018-07-15: qty 1

## 2018-07-15 MED ORDER — TRAZODONE HCL 50 MG PO TABS
50.0000 mg | ORAL_TABLET | Freq: Every day | ORAL | Status: DC
  Filled 2018-07-15 (×3): qty 1
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
## 2018-07-15 NOTE — ED Provider Notes (Signed)
[facility]-EMERGENCY DEPT Provider Note   CSN: [id number] Arrival date & time: 07/15/18  1046     History   Chief Complaint Chief Complaint  Patient presents with  . Suicidal  . IVC    HPI David [name] is a 22 y.o. male.  Patient with hx schizoaffective disorder, presents w ivc. Per report, pt cutting at right wrist, punching self, agitated and aggressive behavior, and has been non compliant w taking his meds for past several days. Pt w very little insight into symptoms, uncooperative, indicates he is okay - level 5 caveat - psychiatric illness.   The history is provided by the patient and a relative. The history is limited by the condition of the patient.    Past Medical History:  Diagnosis Date  . Psychosis (HCC)   . Suicidal ideation     Patient Active Problem List   Diagnosis Date Noted  . Schizoaffective disorder, bipolar type (HCC) 05/28/2018  . Bipolar I disorder, most recent episode (or current) manic (HCC) 10/14/2017  . Psychosis (HCC) 10/13/2017  . Avulsion of left ear 08/24/2016  . Multiple fractures of cervical spine (HCC) 08/24/2016  . Mesenteric hematoma 08/24/2016  . Serosal tear of colon 08/24/2016  . Multiple abrasions 08/24/2016  . Open fracture of right tibia and fibula 08/24/2016  . Multiple closed tarsal fractures of left foot 08/24/2016  . Acute blood loss anemia 08/24/2016  . Acute urinary retention 08/24/2016  . Blunt trauma 08/22/2016  . Aggressive behavior     Past Surgical History:  Procedure Laterality Date  . APPLICATION OF WOUND VAC Right 08/23/2016   Procedure: WOUND VAC EXCHANGE;  Surgeon: Tarry Kos, MD;  [facility]: [facility] OR;  Service: Orthopedics;  Laterality: Right;  . EXTERNAL FIXATION LEG  08/22/2016   Procedure: POSSIBLE EXTERNAL FIXATION LEG;  Surgeon: Gaynelle Adu, MD;  Location: [facility] OR;  Service: General;;  . EXTERNAL FIXATION REMOVAL Right 08/23/2016   Procedure: REMOVAL EXTERNAL FIXATION LEG;  Surgeon:  Tarry Kos, MD;  Location: [facility] OR;  Service: Orthopedics;  Laterality: Right;  . I&D EXTREMITY Right 08/22/2016   Procedure: IRRIGATION AND DEBRIDEMENT RIGHT LEG AND CASTING;  Surgeon: Gaynelle Adu, MD;  Location: [facility] OR;  Service: General;  Laterality: Right;  . LAPAROSCOPY N/A 08/22/2016   Procedure: LAPAROSCOPY DIAGNOSTIC;  Surgeon: Gaynelle Adu, MD;  Location: [facility] OR;  Service: General;  Laterality: N/A;  . LAPAROTOMY N/A 08/22/2016   Procedure: Exploratory LAPAROTOMY repair of right colon serosal tear - pre-op blunt abdominal trauma;  Surgeon: Gaynelle Adu, MD;  Location: [facility] OR;  Service: General;  Laterality: N/A;  . OTOPLASATY Left 08/22/2016   Procedure: REPAIR OF LEFT EAR;  Surgeon: Gaynelle Adu, MD;  Location: [facility] OR;  Service: General;  Laterality: Left;  . TIBIA IM NAIL INSERTION Right 08/23/2016   Procedure: INTRAMEDULLARY (IM) NAIL TIBIAL;  Surgeon: Tarry Kos, MD;  [facility] OR;  Service: Orthopedics;  Laterality: Right;        Home Medications    Prior to Admission medications   Medication Sig Start Date End Date Taking? Authorizing Provider  divalproex (DEPAKOTE ER) 500 MG 24 hr tablet Take 1 tablet (500 mg total) by mouth every 12 (twelve) hours. 05/31/18   Charm Rings, NP  Doxylamine Succinate, Sleep, (SLEEP AID PO) Take 2 capsules by mouth daily as needed (Sleep).    [provider]  OLANZapine zydis (ZYPREXA) 10 MG disintegrating tablet Take 1 tablet (10 mg total) by mouth  every 8 (eight) hours as needed (agitation). 05/31/18   Charm RingsLord, Jamison Y, NP  risperiDONE (RISPERDAL) 1 MG tablet Take 1 tablet (1 mg total) by mouth 2 (two) times daily. 05/31/18   Charm RingsLord, Jamison Y, NP    Family History No family history on file.  Social History Social History   Tobacco Use  . Smoking status: Current Every Day Smoker    Packs/day: 1.00  . Smokeless tobacco: Never Used  Substance Use Topics  . Alcohol use: Yes    Comment: last  drank beer 12-24-17  . Drug use: No      Allergies   Patient has no known allergies.   Review of Systems Review of Systems  Constitutional: Negative for fever.  HENT: Negative for sore throat.   Eyes: Negative for redness.  Respiratory: Negative for shortness of breath.   Cardiovascular: Negative for chest pain.  Gastrointestinal: Negative for abdominal pain.  Genitourinary: Negative for flank pain.  Musculoskeletal: Negative for back pain.  Skin: Negative for rash.  Neurological: Negative for headaches.  Hematological: Does not bruise/bleed easily.  Psychiatric/Behavioral: Negative for suicidal ideas.     Physical Exam Updated Vital Signs BP (!) 141/85 (BP Location: Right Arm)   Pulse 98   Temp 99 F (37.2 C) (Oral)   Resp 20   Ht 1.829 m (6')   Wt 79.4 kg (175 lb)   SpO2 99%   BMI 23.73 kg/m   Physical Exam  Constitutional: He appears well-developed and well-nourished.  HENT:  Head: Atraumatic.  Mouth/Throat: Oropharynx is clear and moist.  Eyes: Pupils are equal, round, and reactive to light. Conjunctivae are normal.  Neck: Neck supple. No tracheal deviation present.  Cardiovascular: Normal rate, regular rhythm, normal heart sounds and intact distal pulses.  Pulmonary/Chest: Effort normal and breath sounds normal. No accessory muscle usage. No respiratory distress.  Abdominal: Soft. He exhibits no distension. There is no tenderness.  Musculoskeletal: He exhibits no edema.  Neurological: He is alert.  Speech clear/fluent. Steady gait.   Skin: Skin is warm and dry. No rash noted.  Psychiatric:  Patient seems anxious, hypervigilant.  Denies SI.   Nursing note and vitals reviewed.    ED Treatments / Results  Labs (all labs ordered are listed, but only abnormal results are displayed) Results for orders placed or performed during the hospital encounter of 07/15/18  Comprehensive metabolic panel  Result Value Ref Range   Sodium 141 135 - 145 mmol/L   Potassium 4.0 3.5 - 5.1 mmol/L   Chloride  105 98 - 111 mmol/L   CO2 26 22 - 32 mmol/L   Glucose, Bld 110 (H) 70 - 99 mg/dL   BUN 14 6 - 20 mg/dL   Creatinine, Ser 1.611.06 0.61 - 1.24 mg/dL   Calcium 9.4 8.9 - 09.610.3 mg/dL   Total Protein 7.7 6.5 - 8.1 g/dL   Albumin 4.4 3.5 - 5.0 g/dL   AST 19 15 - 41 U/L   ALT 18 0 - 44 U/L   Alkaline Phosphatase 56 38 - 126 U/L   Total Bilirubin 0.3 0.3 - 1.2 mg/dL   GFR calc non Af Amer >60 >60 mL/min   GFR calc Af Amer >60 >60 mL/min   Anion gap 10 5 - 15  Ethanol  Result Value Ref Range   Alcohol, Ethyl (B) <10 <10 mg/dL  Salicylate level  Result Value Ref Range   Salicylate Lvl <7.0 2.8 - 30.0 mg/dL  Acetaminophen level  Result Value Ref Range  Acetaminophen (Tylenol), Serum <10 (L) 10 - 30 ug/mL  cbc  Result Value Ref Range   WBC 7.3 4.0 - 10.5 K/uL   RBC 5.23 4.22 - 5.81 MIL/uL   Hemoglobin 15.0 13.0 - 17.0 g/dL   HCT 19.1 47.8 - 29.5 %   MCV 84.5 78.0 - 100.0 fL   MCH 28.7 26.0 - 34.0 pg   MCHC 33.9 30.0 - 36.0 g/dL   RDW 62.1 30.8 - 65.7 %   Platelets 293 150 - 400 K/uL    EKG None  Radiology No results found.  Procedures Procedures (including critical care time)  Medications Ordered in ED Medications - No data to display   Initial Impression / Assessment and Plan / ED Course  I have reviewed the triage vital signs and the nursing notes.  Pertinent labs & imaging results that were available during my care of the patient were reviewed by me and considered in my medical decision making (see chart for details).  Labs sent. [facility] team consulted.   Reviewed nursing notes and prior charts for additional history.   Recheck, alert, content.   Vitals:   07/15/18 1102 07/15/18 1113  BP: (!) 141/85   Pulse: 98   Resp: 20   Temp: 99 F (37.2 C)   SpO2: 98% 99%   Disposition per [facility] team.     Final Clinical Impressions(s) / ED Diagnoses   Final diagnoses:  None    ED Discharge Orders    None       Cathren Laine, MD 07/15/18 1242

## 2018-07-15 NOTE — ED Notes (Signed)
[facility] All belongings returned to pt. Pt not cooperative with this pt to sign for belongings. Pt was cooperative with [facility] at [facility].
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
## 2018-07-15 NOTE — BH Assessment (Signed)
[facility] Assessment Progress Note Case was staffed with Parks FNP who recommended a inpatient admission to assist with stabilization as appropriate bed placement is investigated.      

## 2018-07-15 NOTE — ED Notes (Signed)
[facility]. [employer] called for transport.

## 2018-07-15 NOTE — BH Assessment (Signed)
Admission Note: Patient is an 22 year old male admitted to the unit for symptoms of auditory hallucination and self injurious behaviors.  Patient presents with flat affect and depressed mood.  Appears very sleepy and drowsy during admission process.  Denies suicidal thoughts, auditory and visual hallucinations.  States he doesn't know why he is here.  Admission plan of care reviewed and consent signed.  Personal belonging and skin assessment completed.  No contraband found.  Old superficial cut noted on left forearm and old scar on right lower extremities.  Patient oriented to the unit, staff and room.  Routine safety checks initiated every 15 minutes.  Patient is safe on the unit.

## 2018-07-15 NOTE — Tx Team (Signed)
Initial Treatment Plan 07/15/2018 7:06 PM David MustCameron M Tellado ZOX:[id number]    PATIENT STRESSORS: Health problems Legal issue Medication change or noncompliance   PATIENT STRENGTHS: Communication skills Supportive family/friends   PATIENT IDENTIFIED PROBLEMS: Depression  Suicidal Ideation                   DISCHARGE CRITERIA:  Ability to meet basic life and health needs Adequate post-discharge living arrangements  PRELIMINARY DISCHARGE PLAN: Outpatient therapy Return to previous living arrangement  PATIENT/FAMILY INVOLVEMENT: This treatment plan has been presented to and reviewed with the patient, David [name], and/or family member.  The patient and family have been given the opportunity to ask questions and make suggestions.  Clarene CritchleyElizabeth O Odarius Dines, RN 07/15/2018, 7:06 PM

## 2018-07-15 NOTE — ED Notes (Signed)
Bed: WA27 Expected date:  Expected time:  Means of arrival:  Comments: 

## 2018-07-15 NOTE — ED Notes (Signed)
Bed: [id number] Expected date:  Expected time:  Means of arrival:  Comments: Hold for 27 

## 2018-07-15 NOTE — ED Notes (Signed)
Bizarre behavior, laughing hysterically, talking to someone in his room when no one is present. He picked the laminate veneer off of part of his tray table. Unknown what he did with the pieces. When it was mentioned to him he said, "I'm sorry, and laughed hysterically."  The tray table was removed from his room.

## 2018-07-15 NOTE — BH Assessment (Signed)
[facility]  Per Juanetta BeetsJacqueline Norman, DO, this pt requires psychiatric hospitalization.  Malva LimesLinsey Strader, RN, [id number].  This bed is currently occupied; PLEASE DO NOT SEND PATIENT UNTIL [facility] CALLS TO NOTIFY THAT [facility] IS READY.  Pt presents under IVC initiated by pt's mother, and upheld by Dr Sharma CovertNorman, and [facility].  Pt's nurse, Diane, has been notified, and agrees to call report to [phone].  Pt is to be transported via [employer].   Doylene Canninghomas Chasady [place], [place] Behavioral Health Coordinator [phone]

## 2018-07-15 NOTE — ED Notes (Signed)
Pt belongings locked up in locker #35. Belongings: Black sneakers, black zip-up, black shirt, blue sweat pants, black socks. Pts belongings have been itemized on belongings sheet located in pt chart at nurses station. Pt signed belongings sheet acknowledging his understanding that belongings would remain locked up till discharge.

## 2018-07-15 NOTE — ED Notes (Signed)
Pt has been I bed sleeping this afternoon. No problems noted.

## 2018-07-16 ENCOUNTER — Encounter (HOSPITAL_COMMUNITY): Payer: Self-pay

## 2018-07-16 DIAGNOSIS — F200 Paranoid schizophrenia: Principal | ICD-10-CM

## 2018-07-16 LAB — RAPID URINE DRUG SCREEN, HOSP PERFORMED
Amphetamines: NOT DETECTED
BARBITURATES: NOT DETECTED
Benzodiazepines: NOT DETECTED
COCAINE: NOT DETECTED
Opiates: NOT DETECTED
TETRAHYDROCANNABINOL: NOT DETECTED

## 2018-07-16 LAB — VALPROIC ACID LEVEL: Valproic Acid Lvl: <10 ug/mL — ABNORMAL LOW (ref 50.0–100.0)

## 2018-07-16 MED ORDER — OLANZAPINE 5 MG PO TBDP
5.0000 mg | ORAL_TABLET | Freq: Every day | ORAL | Status: DC
  Administered 2018-07-16 – 2018-07-18 (×3): 5 mg via ORAL
  Filled 2018-07-16 (×5): qty 1
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
## 2018-07-16 NOTE — Progress Notes (Signed)
Adult Psychoeducational Group Note  Date:  07/16/2018 Time:  9:21 AM  Group Topic/Focus:  Goals Group:   The focus of this group is to help patients establish daily goals to achieve during treatment and discuss how the patient can incorporate goal setting into their daily lives to aide in recovery. Orientation:   The focus of this group is to educate the patient on the purpose and policies of crisis stabilization and provide a format to answer questions about their admission.  The group details unit policies and expectations of patients while admitted.  Participation Level:  Minimal  Participation Quality:  Appropriate  Affect:  Appropriate and Flat  Cognitive:  Appropriate  Insight: Good  Engagement in Group:  Engaged  Modes of Intervention:  Discussion and Orientation  Additional Comments:  Pt was active during orientation and goals group. Pt stated his goal is to work on discharge planning. Pt stated that he needs to find a job and learn coping skills for his anger. Pt stated that he has a good relationship with his family. Pt stated when he gets mad or emotional he copes with self-injury. Pt stated his feelings are at a nine and he is feeling pretty good. Pt denies SI/HI . Pt contracts for safety.   Daris Aristizabal Chanel 07/16/2018, 9:21 AM
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
## 2018-07-16 NOTE — Plan of Care (Signed)
D: Patient presents angry about being here. He does not understand his reason for being in the behavioral health hospital. He reports sleeping well last night, and did not receive medication to help with sleep. His appetite is good, energy low, and concentration poor. He rates his depression, sense of hopelessness and anxiety 0/10. He denies withdrawal from drugs, but reports having anhedonia and "not feeling." He denies physical symptoms or pain. Patient denies SI/HI/AVH.  A: Patient checked q15 min, and checks reviewed. Reviewed medication changes with patient and educated on side effects. Educated patient on importance of attending group therapy sessions and educated on several coping skills. Encouarged participation in milieu through recreation therapy and attending meals with peers. Support and encouragement provided. Fluids offered. R: Patient receptive to education on medications, and is medication compliant. Patient contracts for safety on the unit. Patient did not set a goal for today.
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
## 2018-07-16 NOTE — Progress Notes (Signed)
Patient did not attend evening group session.  

## 2018-07-16 NOTE — [facility] Group Notes (Signed)
[facility] Group Notes:  (Nursing)  Date:  07/16/2018  Time: 1:30 PM Type of Therapy:  Nurse Education  Participation Level:  Did Not Attend  Shela NevinValerie S Anniah Glick 07/16/2018, 3:48 PM

## 2018-07-16 NOTE — [facility] Group Notes (Signed)
[facility] Group Notes: (Clinical Social Work)   07/16/2018      Type of Therapy:  Group Therapy   Participation Level:  Did Not Attend despite [facility] prompting   David Dechert N Muhammad Vacca, LCSW  07/16/2018 1:11 PM   
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
## 2018-07-17 NOTE — Progress Notes (Signed)
D: Pt asked the writer if she knew what "dystonia" meant. Writer explained that she did. Pt started demonstrating the symptoms that he experienced. Pt twisted his head to the side, held up his arm and put it behind his back, and made other movements. Stated that "cogentin" made him have the movements. Writer attempted to explain that more than likely the cogentin was helping him so he wouldn't have the symptoms. Pt has no questions or concerns.    A:  Support and encouragement was offered. 15 min checks continued for safety.  R: Pt remains safe.

## 2018-07-17 NOTE — Progress Notes (Signed)
Adult Psychoeducational Group Note  Date:  07/17/2018 Time:  9:26 PM  Group Topic/Focus:  Wrap-Up Group:   The focus of this group is to help patients review their daily goal of treatment and discuss progress on daily workbooks.  Participation Level:  Minimal  Participation Quality:  Appropriate  Affect:  Appropriate  Cognitive:  Appropriate  Insight: Limited  Engagement in Group:  Engaged  Modes of Intervention:  Socialization and Support  Additional Comments:  Patient attended and participated in group tonight. He reports having a good day. He drank coffee, went for groups and meals. She also spoke with her doctor.  Lita MainsFrancis, [facility] LPDacosta 07/17/2018, 9:26 PM
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
## 2018-07-17 NOTE — [facility] Group Notes (Signed)
[facility] LCSW Group Therapy Note  07/17/2018  11:00-11:40AM  Type of Therapy and Topic:  Group Therapy:  Building Supports  Participation Level:  None  Description of Group:  Patients in this group were introduced to the idea of adding a variety of healthy supports to address the various needs in their lives.  Different types of support were defined and described, and patients were asked to act out what each type could be.  Patients discussed what additional healthy supports could be helpful in their recovery and wellness after discharge in order to prevent future hospitalizations.   An emphasis was placed on following up with the discharge plan when they leave the hospital in order to continue becoming healthier and happier.  Therapeutic Goals: 1)  demonstrate the importance of adding supports  2)  discuss 4 definitions of support  3)  identify the patient's current level of healthy support and   4)  elicit commitments to add one healthy support   Summary of Patient Progress:  Patient came in at the very end of group. Patient's only comment was "I don't have any supports".   Therapeutic Modalities:   Motivational Interviewing Brief Solution-Focused Therapy  Shellia CleverlyStephanie N Elandra Powell

## 2018-07-17 NOTE — Plan of Care (Signed)
D: Patient presents calm, cooperative. He continues to ask about discharge, and does not seem to understand the reason for his admission. He says he is "feeling alright." He reports sleeping well last night. Patient denies SI/HI/AVH.  A: Patient checked q15 min, and checks reviewed. Reviewed medication changes with patient and educated on side effects. Educated patient on importance of attending group therapy sessions and educated on several coping skills. Encouarged participation in milieu through recreation therapy and attending meals with peers. Support and encouragement provided. Fluids offered. R: Patient receptive to education on medications, and is medication compliant. Patient contracts for safety on the unit. Patient did not complete a self inventory sheet, and did not set a goal for today.

## 2018-07-18 NOTE — Plan of Care (Signed)
  Problem: Education: Goal: Emotional status will improve Outcome: Progressing   Problem: Coping: Goal: Ability to identify and develop effective coping behavior will improve Outcome: Progressing   Problem: Activity: Goal: Interest or engagement in leisure activities will improve Outcome: Progressing  DAR NOTE: Patient presents with flat affect and depressed mood.  Denies suicidal thoughts, pain, auditory and visual hallucinations.  Rates depression at 0, hopelessness at 0, and anxiety at 0.  Maintained on routine safety checks.  Medications given as prescribed.  Support and encouragement offered as needed.  Attended group and participated.  States goal for today is "discharge."  Patient visible in milieu with minimal interactions.  Offered no complaint.
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
## 2018-07-18 NOTE — Progress Notes (Signed)
Recreation Therapy Notes  Date: 8.5.19 Time: 1000 Location: 500 Hall Dayroom  Group Topic: Triggers  Goal Area(s) Addresses:  Patient will identify triggers. Patient will identify what strategies they use to avoid triggers Patient will  Identify how they face triggers head on.  Behavioral Response: Engaged  Intervention:  Worksheet  Activity: Triggers.  Patients were given a worksheet on triggers.  Patients were to identify their triggers.  They were then to address how they avoid their triggers and how they face them head on when they can't be avoided.  Education:  Triggers, Discharge Planning  Education Outcome: Acknowledges understanding/In group clarification offered/Needs additional education.   Clinical Observations/Feedback: Pt arrived late but worked on assignment.  Pt identified his triggers as "walking back and forth, staring/talking when I don't know someone and name calling".  Pt stated he avoids triggers by isolating, keeping to himself and understanding people before communicating with them.  Pt didn't identify ways to face triggers head on.  Pt left early and didn't return.    Caroll RancherMarjette Numair Masden, LRT/CTRS     Caroll RancherLindsay, Harlem Bula A 07/18/2018 12:13 PM

## 2018-07-18 NOTE — Tx Team (Signed)
Interdisciplinary Treatment and Diagnostic Plan Update  07/18/2018 Time of Session: 8:23 AM  David [name] MRN: [id number]  Principal Diagnosis: Schizophrenia, paranoid [facility])  Secondary Diagnoses: Principal Problem:   Schizophrenia, paranoid ([place])   Current Medications:  Current Facility-Administered Medications  Medication Dose Route Frequency Provider Last Rate Last Dose  . acetaminophen (TYLENOL) tablet 650 mg  650 mg Oral Q6H PRN Sharma Covert, MD      . alum & mag hydroxide-simeth (MAALOX/MYLANTA) 200-200-20 MG/5ML suspension 30 mL  30 mL Oral Q4H PRN Sharma Covert, MD      . benztropine (COGENTIN) tablet 0.5 mg  0.5 mg Oral Daily Sharma Covert, MD   0.5 mg at 07/17/18 0749  . divalproex (DEPAKOTE) DR tablet 500 mg  500 mg Oral QHS Sharma Covert, MD   500 mg at 07/17/18 2134  . hydrOXYzine (ATARAX/VISTARIL) tablet 25 mg  25 mg Oral TID PRN Sharma Covert, MD      . ziprasidone (GEODON) injection 20 mg  20 mg Intramuscular Q12H PRN Sharma Covert, MD       And  . LORazepam (ATIVAN) tablet 1 mg  1 mg Oral PRN Sharma Covert, MD      . magnesium hydroxide (MILK OF MAGNESIA) suspension 30 mL  30 mL Oral Daily PRN Sharma Covert, MD      . OLANZapine zydis (ZYPREXA) disintegrating tablet 5 mg  5 mg Oral QHS Sharma Covert, MD   5 mg at 07/17/18 2134  . traZODone (DESYREL) tablet 50 mg  50 mg Oral QHS PRN Sharma Covert, MD   50 mg at 07/17/18 2136    PTA Medications: Medications Prior to Admission  Medication Sig Dispense Refill Last Dose  . acetaminophen (TYLENOL) 325 MG tablet Take 650 mg by mouth every 6 (six) hours as needed for mild pain or headache.   07/16/2018  . divalproex (DEPAKOTE ER) 500 MG 24 hr tablet Take 1 tablet (500 mg total) by mouth every 12 (twelve) hours. 60 tablet 0 07/16/2018  . Doxylamine Succinate, Sleep, (SLEEP AID PO) Take 2 capsules by mouth daily as needed (Sleep).   07/16/2018  . OLANZapine zydis (ZYPREXA) 10 MG  disintegrating tablet Take 1 tablet (10 mg total) by mouth every 8 (eight) hours as needed (agitation). 30 tablet 0 07/16/2018  . risperiDONE (RISPERDAL) 1 MG tablet Take 1 tablet (1 mg total) by mouth 2 (two) times daily. 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   Physician Treatment Plan for Primary Diagnosis: Schizophrenia, paranoid ([place]) Long Term Goal(s): Improvement in symptoms so as ready for discharge  Short Term Goals: Ability to identify changes in lifestyle to reduce recurrence of condition will improve Ability to verbalize feelings will improve Ability to disclose and discuss suicidal ideas Ability to demonstrate self-control will improve Ability to identify and develop effective coping behaviors will improve Ability to maintain clinical measurements within normal limits will improve Compliance with prescribed medications will improve  Medication Management: Evaluate patient's response, side effects, and tolerance of medication regimen.  Therapeutic Interventions: 1 to 1 sessions, Unit Group sessions and Medication administration.  Evaluation of Outcomes: Progressing  Physician Treatment Plan for Secondary Diagnosis: Principal Problem:   Schizophrenia, paranoid ([place])   Long Term Goal(s): Improvement in  symptoms so as ready for discharge  Short Term Goals: Ability to identify changes in lifestyle to reduce recurrence of condition will improve Ability to verbalize feelings will improve Ability to disclose and discuss suicidal ideas Ability to demonstrate self-control will improve Ability to identify and develop effective coping behaviors will improve Ability to maintain clinical  measurements within normal limits will improve Compliance with prescribed medications will improve  Medication Management: Evaluate patient's response, side effects, and tolerance of medication regimen.  Therapeutic Interventions: 1 to 1 sessions, Unit Group sessions and Medication administration.  Evaluation of Outcomes: Progressing   RN Treatment Plan for Primary Diagnosis: Schizophrenia, paranoid ([place]) Long Term Goal(s): Knowledge of disease and therapeutic regimen to maintain health will improve  Short Term Goals: Ability to identify and develop effective coping behaviors will improve and Compliance with prescribed medications will improve  Medication Management: RN will administer medications as ordered by provider, will assess and evaluate patient's response and provide education to patient for prescribed medication. RN will report any adverse and/or side effects to prescribing provider.  Therapeutic Interventions: 1 on 1 counseling sessions, Psychoeducation, Medication administration, Evaluate responses to treatment, Monitor vital signs and CBGs as ordered, Perform/monitor CIWA, COWS, AIMS and Fall Risk screenings as ordered, Perform wound care treatments as ordered.  Evaluation of Outcomes: Progressing   LCSW Treatment Plan for Primary Diagnosis: Schizophrenia, paranoid ([place]) Long Term Goal(s): Safe transition to appropriate next level of care at discharge, Engage patient in therapeutic group addressing interpersonal concerns.  Short Term Goals: Engage patient in aftercare planning with referrals and resources  Therapeutic Interventions: Assess for all discharge needs, 1 to 1 time with Social worker, Explore available resources and support systems, Assess for adequacy in community support network, Educate family and significant other(s) on suicide prevention, Complete Psychosocial Assessment, Interpersonal group therapy.  Evaluation of Outcomes: Met  Return home, follow up  outpt   Progress in Treatment: Attending groups: Yes Participating in groups: Yes Taking medication as prescribed: Yes Toleration medication: Yes, no side effects reported at this time Family/Significant other contact made: No Patient understands diagnosis: No Limited insight Discussing patient identified problems/goals with staff: Yes Medical problems stabilized or resolved: Yes Denies suicidal/homicidal ideation: Yes Issues/concerns per patient self-inventory: None Other: N/A  New problem(s) identified: None identified at this time.   New Short Term/Long Term Goal(s): "I have no goals other than getting discharged."   Discharge Plan or Barriers:   Reason for Continuation of Hospitalization:  Hallucinations  Medication stabilization   Estimated Length of Stay: 8/9  Attendees: Patient: Antolin Belsito 07/18/2018  8:23 AM  Physician: Maris Berger, MD 07/18/2018  8:23 AM  Nursing: Sena Hitch, RN 07/18/2018  8:23 AM  RN Care Manager: Lars Pinks, RN 07/18/2018  8:23 AM  Social Worker: Ripley Fraise 07/18/2018  8:23 AM  Recreational Therapist: Winfield Cunas 07/18/2018  8:23 AM  Other: Norberto Sorenson 07/18/2018  8:23 AM  Other:  07/18/2018  8:23 AM    Scribe for Treatment Team:  Roque Lias LCSW 07/18/2018 8:23 AM

## 2018-07-18 NOTE — Progress Notes (Signed)
Nursing Progress Note: 7p-7a D: Pt currently presents with a depression/anxiety/flat affect and behavior. Pt states "I had a good day." Interacting invasively/controlling with the milieu. Pt reports good sleep during the previous night with current medication regimen. Pt did attend wrap-up group.  A: Pt provided with medications per providers orders. Pt's labs and vitals were monitored throughout the night. Pt supported emotionally and encouraged to express concerns and questions. Pt educated on medications.  R: Pt's safety ensured with 15 minute and environmental checks. Pt currently denies SI, HI, and AVH. Pt verbally contracts to seek staff if SI,HI, or AVH occurs and to consult with staff before acting on any harmful thoughts. Will continue to monitor.

## 2018-07-18 NOTE — Progress Notes (Signed)
Recreation Therapy Notes  INPATIENT RECREATION THERAPY ASSESSMENT  Patient Details Name: David [name] MRN: [id number] DOB: [birth year] Today's Date: 07/18/2018       Information Obtained From: Patient  Able to Participate in Assessment/Interview: Yes  Patient Presentation: Alert, Oriented  Reason for Admission (Per Patient): Other (Comments)(Pt stated he has no idea why he is here.)  Patient Stressors: (Pt identified no stressors.)  Coping Skills:   TV, Exercise, Music, Talk, Read  Leisure Interests (2+):  Individual - Reading, Music - Listen  Frequency of Recreation/Participation: Other (Comment)(Daily)  Awareness of Community Resources:  No  Expressed Interest in [employer] CorporationCommunity Resource Information: No  County of Residence:  [place]  Patient Main Form of Transportation: Set designerCar  Patient Strengths:  Hands on working; Coping with others  Patient Identified Areas of Improvement:  "No idea"  Patient Goal for Hospitalization:  "Get medication"  Current SI (including self-harm):  No  Current HI:  No  Current AVH: No  Staff Intervention Plan: Group Attendance, Collaborate with Interdisciplinary Treatment Team  Consent to Intern Participation: N/A    Caroll RancherMarjette Kemari Mares, LRT/CTRS  Lillia AbedLindsay, Proctor Carriker A 07/18/2018, 2:11 PM

## 2018-07-18 NOTE — Progress Notes (Signed)
Pt attend wrap up group. His day was a 8. The one positive thing that happened to hime toay watch tv. He needs to work on a discharge plan.

## 2018-07-18 NOTE — [facility] Group Notes (Signed)
LCSW Group Therapy Note  07/18/2018 1:15pm  Type of Therapy/Topic:  Group Therapy:  Feelings about Diagnosis  Participation Level:  Minimal   Description of Group:   This group will allow patients to explore their thoughts and feelings about diagnoses they have received. Patients will be guided to explore their level of understanding and acceptance of these diagnoses. Facilitator will encourage patients to process their thoughts and feelings about the reactions of others to their diagnosis and will guide patients in identifying ways to discuss their diagnosis with significant others in their lives. This group will be process-oriented, with patients participating in exploration of their own experiences, giving and receiving support, and processing challenge from other group members.   Therapeutic Goals: 1. Patient will demonstrate understanding of diagnosis as evidenced by identifying two or more symptoms of the disorder 2. Patient will be able to express two feelings regarding the diagnosis 3. Patient will demonstrate their ability to communicate their needs through discussion and/or role play  Summary of Patient Progress:  Stayed the entire time, appeared engaged throughout.  Minimal insight.  To every question he responded "I don't know."  Shared nothing.     Therapeutic Modalities:   Cognitive Behavioral Therapy Brief Therapy Feelings Identification    Ida RogueRodney B Briceida Rasberry, LCSW 07/18/2018 3:36 PM

## 2018-07-19 MED ORDER — ARIPIPRAZOLE 10 MG PO TABS
10.0000 mg | ORAL_TABLET | Freq: Every day | ORAL | Status: DC
  Administered 2018-07-19 – 2018-07-21 (×3): 10 mg via ORAL
  Filled 2018-07-19 (×4): qty 1
  Filled 2018-07-19: qty 7
  Filled 2018-07-19 (×2): qty 1
  Filled 2018-07-19: qty 7

## 2018-07-19 NOTE — Progress Notes (Signed)
Recreation Therapy Notes  Date: 8.6.19 Time: 1000 Location: 500 Hall Dayroom  Group Topic: Communication, Team Building, Problem Solving  Goal Area(s) Addresses:  Patient will effectively work with peer towards shared goal.  Patient will identify skill used to make activity successful.  Patient will identify how skills used during activity can be used to reach post d/c goals.   Intervention: STEM Activity   Activity: In team's, using 20 plastic straws and 24 inches of masking tape, patients were asked to build an elevated bridge that would hold [name] light weight paperback book.    Education: Pharmacist, communityocial Skills, Building control surveyorDischarge Planning.   Education Outcome: Acknowledges education/In group clarification offered/Needs additional education.   Clinical Observations/Feedback: Pt did not attend group.    David RancherMarjette Gwendlyn [name], LRT/CTRS         David RancherLindsay, David [name] 07/19/2018 11:41 AM

## 2018-07-19 NOTE — Progress Notes (Signed)
D:  Patient's self inventory sheet, patient has good sleep, sleep medication helpful.  Good appetite, normal energy level, good concentration.  Denied depression, anxiety, hopeless.  Denied withdrawals.  Denied SI.  Denied physical problems.  Denied physical pain  Denied pain medicine.  Goal is discharge.  Plans to talk to MD.  Does have discharge plans.   A:  Medications administered per MD orders.  Emotional support and encouragement given patient. R:  Patient denied SI and HI, contracts for safety.  Denied A/V hallucinations.  Safety maintained with 15 minute checks.
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
## 2018-07-19 NOTE — Plan of Care (Signed)
Nurse discussed anxiety, depression, coping skills with patient. 

## 2018-07-19 NOTE — [facility] Group Notes (Signed)
LCSW Group Therapy Note   07/19/2018 1:15pm   Type of Therapy and Topic:  Group Therapy:  Positive Affirmations   Participation Level:  None  Description of Group: This group addressed positive affirmation toward self and others. Patients went around the room and identified two positive things about themselves and two positive things about a peer in the room. Patients reflected on how it felt to share something positive with others, to identify positive things about themselves, and to hear positive things from others. Patients were encouraged to have a daily reflection of positive characteristics or circumstances.  Therapeutic Goals 1. Patient will verbalize two of their positive qualities 2. Patient will demonstrate empathy for others by stating two positive qualities about a peer in the group 3. Patient will verbalize their feelings when voicing positive self affirmations and when voicing positive affirmations of others 4. Patients will discuss the potential positive impact on their wellness/recovery of focusing on positive traits of self and others. Summary of Patient Progress:  Stayed for most of the group, appeared engaged.  Left about three quarters of the way through.  Did not return.  Contributed nothing while with us.  Therapeutic Modalities Cognitive Behavioral Therapy Motivational Interviewing  Ida RogueRodney B Rebbecca Osuna, KentuckyLCSW 07/19/2018 1:39 PM

## 2018-07-19 NOTE — [facility] Suicide Risk Assessment (Signed)
[facility] INPATIENT:  Family/Significant Other Suicide Prevention Education  Suicide Prevention Education:  Education Completed; David LegatoSarah [name], mother, [phone]  has been identified by the patient as the family member/significant other with whom the patient will be residing, and identified as the person(s) who will aid the patient in the event of a mental health crisis (suicidal ideations/suicide attempt).  With written consent from the patient, the family member/significant other has been provided the following suicide prevention education, prior to the and/or following the discharge of the patient.  The suicide prevention education provided includes the following:  Suicide risk factors  Suicide prevention and interventions  National Suicide Hotline telephone number  [facility] assessment telephone number  [facility] Emergency Assistance 911  [facility] and/or Residential Mobile Crisis Unit telephone number  Request made of family/significant other to:  Remove weapons (e.g., guns, rifles, knives), all items previously/currently identified as safety concern.    Remove drugs/medications (over-the-counter, prescriptions, illicit drugs), all items previously/currently identified as a safety concern.  The family member/significant other verbalizes understanding of the suicide prevention education information provided.  The family member/significant other agrees to remove the items of safety concern listed above.  We came up with a plan that [facility] and injectable medication.  Also talked to mother about applying for disability, and how to go about doing that. She has 2 other sons who keep pushing her to get patient more help, but she feels overwhelmed because she gives in to his persistence.  Also, she recently had a stroke and is recovering.  David [name] 07/19/2018, 1:42 PM

## 2018-07-19 NOTE — Progress Notes (Signed)
Adult Psychoeducational Group Note  Date:  07/19/2018 Time:  9:04 PM  Group Topic/Focus:  Wrap-Up Group:   The focus of this group is to help patients review their daily goal of treatment and discuss progress on daily workbooks.  Participation Level:  Active  Participation Quality:  Appropriate  Affect:  Appropriate  Cognitive:  Appropriate  Insight: Appropriate  Engagement in Group:  Engaged  Modes of Intervention:  Discussion  Additional Comments: The patient expressed that he attended groups and rates today a 8.The patient also said that his goal was preparing for discharge.  Octavio Mannshigpen, Errik Mitchelle Lee 07/19/2018, 9:04 PM

## 2018-07-20 MED ORDER — ARIPIPRAZOLE ER 400 MG IM SRER
400.0000 mg | INTRAMUSCULAR | Status: DC
  Administered 2018-07-20: 400 mg via INTRAMUSCULAR
  Filled 2018-07-20: qty 2

## 2018-07-20 NOTE — Progress Notes (Signed)
Recreation Therapy Notes  Date: 8.7.19 Time: 1000 Location: 500 Hall Dayroom  Group Topic: Coping Skills  Goal Area(s) Addresses:  Patient will be able to identify positive coping skills. Patient will be able to identify benefits of coping skills. Patient will identify benefits of using coping skills post d/c.  Behavioral Response: Engaged  Intervention: Worksheet, magazines, scissors, glue sticks, Holiday representativeconstruction paper  Activity: PharmacologistCoping Skills.  Patients were to use the magazines to find pictures that represent coping skills for diversions, social, cognitive, tension releasers and physical.   Education: Coping Skills, Discharge Planning.   Education Outcome: Acknowledges understanding/In group clarification offered/Needs additional education.   Clinical Observations/Feedback:  Pt was social and active.  Pt stated his coping skills were diversions- black and milds; social- soccer; cognitive- getting dressed up/clothes; tension releaser- dog; and physical- basketball.    David RancherMarjette [name], David [name]      David AbedLindsay, David [name] A 07/20/2018 11:26 AM

## 2018-07-20 NOTE — Progress Notes (Signed)
Nursing Progress Note: 7p-7a D: Pt currently presents with a depression/anxiety/flat affect and behavior. Interacting invasively/controlling with the milieu. Pt reports good sleep during the previous night with current medication regimen. Pt did attend wrap-up group.  A: Pt provided with medications per providers orders. Pt's labs and vitals were monitored throughout the night. Pt supported emotionally and encouraged to express concerns and questions. Pt educated on medications.  R: Pt's safety ensured with 15 minute and environmental checks. Pt currently denies SI, HI, and AVH. Pt verbally contracts to seek staff if SI,HI, or AVH occurs and to consult with staff before acting on any harmful thoughts. Will continue to monitor.

## 2018-07-20 NOTE — [facility] Group Notes (Signed)
[facility] Group Notes:  (Nursing/MHT/Case Management/Adjunct)  Date:  07/20/2018  Time:  5:34 PM  Type of Therapy:  Psychoeducational Skills  Participation Level:  Active  Participation Quality:  Appropriate  Affect:  Appropriate  Cognitive:  Appropriate  Insight:  Appropriate  Engagement in Group:  Engaged  Modes of Intervention:  Discussion  Summary of Progress/Problems:  Patient attended group and was appropriate and alert.    Earline MayotteKnight, Dannya Pitkin Shephard 07/20/2018, 5:34 PM
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
## 2018-07-20 NOTE — [facility] Group Notes (Signed)
LCSW Group Therapy Note  07/20/2018 1:15pm  Type of Therapy/Topic:  Group Therapy:  Emotion Regulation  Participation Level:  Minimal   Description of Group:   The purpose of this group is to assist patients in learning to regulate negative emotions and experience positive emotions. Patients will be guided to discuss ways in which they have been vulnerable to their negative emotions. These vulnerabilities will be juxtaposed with experiences of positive emotions or situations, and patients will be challenged to use positive emotions to combat negative ones. Special emphasis will be placed on coping with negative emotions in conflict situations, and patients will process healthy conflict resolution skills.  Therapeutic Goals: 1. Patient will identify two positive emotions or experiences to reflect on in order to balance out negative emotions 2. Patient will label two or more emotions that they find the most difficult to experience 3. Patient will demonstrate positive conflict resolution skills through discussion and/or role plays  Summary of Patient Progress:  Stayed the entire time, engaged througfhout.  "I've been feeling anxious here-like when I am trying to use the phone and I have to wait, I guess." Later overheard on the phone talking to his mother-tellin her about the plan to get the injection.     Therapeutic Modalities:   Cognitive Behavioral Therapy Feelings Identification Dialectical Behavioral Therapy   Ida RogueRodney B Jahmal Dunavant, LCSW 07/20/2018 11:16 AM

## 2018-07-21 MED ORDER — RISPERIDONE 1 MG PO TABS: 1.0000 mg | ORAL_TABLET | Freq: Two times a day (BID) | ORAL | 0 refills | Status: DC

## 2018-07-21 MED ORDER — TRAZODONE HCL 50 MG PO TABS: 50.0000 mg | ORAL_TABLET | Freq: Every evening | ORAL | 0 refills | Status: DC | PRN

## 2018-07-21 MED ORDER — ARIPIPRAZOLE 10 MG PO TABS: 10.0000 mg | ORAL_TABLET | Freq: Every day | ORAL | 0 refills | Status: DC

## 2018-07-21 MED ORDER — DIVALPROEX SODIUM 500 MG PO DR TAB: 500.0000 mg | DELAYED_RELEASE_TABLET | Freq: Every day | ORAL | 0 refills | Status: DC

## 2018-07-21 MED ORDER — HYDROXYZINE HCL 25 MG PO TABS: 25.0000 mg | ORAL_TABLET | Freq: Three times a day (TID) | ORAL | 0 refills | Status: DC | PRN

## 2018-07-21 MED ORDER — BENZTROPINE MESYLATE 0.5 MG PO TABS: 0.5000 mg | ORAL_TABLET | Freq: Every day | ORAL | 0 refills | Status: DC

## 2018-07-21 MED ORDER — ARIPIPRAZOLE ER 400 MG IM SRER: 400.0000 mg | INTRAMUSCULAR | 0 refills | Status: DC

## 2018-07-21 NOTE — Progress Notes (Signed)
Recreation Therapy Notes  Date: 8.8.19 Time: 0950 Location: [address] Dayroom  Group Topic:  Goal Setting  Goal Area(s) Addresses:  Patient will be able to identify at least 3 life goals.  Patient will be able to identify benefit of investing in life goals.  Patient will be able to identify benefit of setting life goals post d/c.   Behavioral Response: None  Intervention: Worksheet, pencils  Activity: Life Goals.  Patients were given a work sheet with six categories (family, friends, work/school, spirituality, body and mental health).  Patients were to identify what they were doing well, what they needed to improve and write a goal to help them make the improvement.  Patients would then share their top 3 categories with the group.  Education: Discharge Planning, PharmacologistCoping Skills, Leisure Education  Education Outcome: Acknowledges Education/In Group Clarification Provided/Needs Additional Education  Clinical Observations: Pt did not complete worksheet.  Pt was preoccupied with his discharge and speaking to the doctor.   Caroll RancherMarjette Lilly Gasser, LRT/CTRS    Lillia AbedLindsay, Evonte Prestage A 07/21/2018 11:30 AM

## 2018-07-21 NOTE — Progress Notes (Addendum)
  [facility]  Will you be returning to the same living situation after discharge:  Yes,  home At discharge, do you have transportation home?: Yes,  mother or TCT Do you have the ability to pay for your medications: Yes,  mentl health  Release of information consent forms completed and in the chart;  Patient's signature needed at discharge.  Patient to Follow up at: Follow-up Information    [facility] Follow up on 07/25/2018.   Why:  Monday at 8AM with Nicole Cellaorothy for your hospital follow up appointment.  Bring along your ID and your hospital d/c paperwork Contact information: [address] [place] [address] [phone]           Next level of care provider [facility] to [facility] Link:no  Safety Planning and Suicide Prevention discussed: Yes,  yes  Have you used any form of tobacco in the last 30 days? (Cigarettes, Smokeless Tobacco, Cigars, and/or Pipes): No  Has patient been referred to the Quitline?: N/A patient is not a smoker  Patient has been referred for addiction treatment: N/A  David RogueRodney B Geneveive Furness, LCSW 07/21/2018, 9:36 AM

## 2018-07-21 NOTE — Progress Notes (Signed)
[name] denied SI, HI, and AVH this a.m. He said he was ready to discharge and voiced concern about his [facility], [facility]. He asked this Clinical research associatewriter to speak with his mom about his discharge, including his meds, which this Clinical research associatewriter did. After that, his mom said his brother would be picking him up. Patient was discharged per order. AVS, SRA, scripts, med samples and transition summary were all reviewed with patient. Pt was given an opportunity to ask questions and verbalized understanding of all discharge paperwork. Belongings were returned, and patient signed for receipt. Patient verbalized readiness for discharge and appeared in no acute distress when escorted to lobby where his brother waited to take him home.
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
## 2018-07-21 NOTE — Plan of Care (Signed)
  Problem: Education: Goal: Mental status will improve Outcome: Progressing-Emric reports that he is feeling better and ready to go home.  He denied SI/HI or A/V hallucinations.   Problem: Medication: Goal: Compliance with prescribed medication regimen will improve Outcome: Progressing-He takes his prescribed medications as prescribed and will ask for prn's if needed.

## 2018-07-21 NOTE — Progress Notes (Signed)
D:  David [name] has been up and visible on the unit.  He was pleasant and cooperative.   He denied SI/HI or A/V hallucinations.  He attended evening wrap up group.  He stated that his day was good.  He is hoping to be discharged soon.  No physical complaints voiced and he appeared to be in no physical distress.  He took his HS medications without difficulty.   A:  1:1 with RN for support and encouragement.  Medication as ordered and prn.  Q 15 minute checks maintained for safety.  Encouraged participation in group and unit activities. R:  David [name] remains safe on the unit.  We will continue to monitor the progress towards his goals.

## 2018-07-21 NOTE — Progress Notes (Signed)
Recreation Therapy Notes  INPATIENT RECREATION TR PLAN  Patient Details Name: David [name] MRN: [id number] DOB: 1996-11-15 Today's Date: 07/21/2018  Rec Therapy Plan Is patient appropriate for Therapeutic Recreation?: Yes Treatment times per week: about 3 days Estimated Length of Stay: 5-7 days TR Treatment/Interventions: Group participation (Comment)  Discharge Criteria Pt will be discharged from therapy if:: Discharged Treatment plan/goals/alternatives discussed and agreed upon by:: Patient/family  Discharge Summary Short term goals set: See patient care plan. Short term goals met: Complete Progress toward goals comments: Groups attended Which groups?: Coping skills, Goal setting, Other (Comment)(Triggers) Reason goals not met: None Therapeutic equipment acquired: N/A Reason patient discharged from therapy: Discharge from hospital Pt/family agrees with progress & goals achieved: Yes Date patient discharged from therapy: 07/21/18    Victorino Sparrow, LRT/CTRS   Ria Comment, Abelina Ketron A 07/21/2018, 11:55 AM

## 2018-07-21 NOTE — Plan of Care (Signed)
Pt was able to identify positive coping skills to decrease depressive symptoms at completion of recreation therapy group sessions.   Caroll RancherMarjette Avalina Benko, LRT/CTRS

## 2018-08-20 ENCOUNTER — Emergency Department (HOSPITAL_COMMUNITY)
Admission: EM | Admit: 2018-08-20 | Discharge: 2018-08-22 | Disposition: A | Discharge status: Home / Self Care | Payer: Medicaid Other | Attending: Emergency Medicine | Admitting: Emergency Medicine

## 2018-08-20 ENCOUNTER — Other Ambulatory Visit: Payer: Self-pay

## 2018-08-20 DIAGNOSIS — F209 Schizophrenia, unspecified: Secondary | ICD-10-CM | POA: Diagnosis not present

## 2018-08-20 DIAGNOSIS — F17200 Nicotine dependence, unspecified, uncomplicated: Secondary | ICD-10-CM | POA: Insufficient documentation

## 2018-08-20 DIAGNOSIS — Z9119 Patient's noncompliance with other medical treatment and regimen: Secondary | ICD-10-CM | POA: Insufficient documentation

## 2018-08-20 DIAGNOSIS — Z79899 Other long term (current) drug therapy: Secondary | ICD-10-CM | POA: Insufficient documentation

## 2018-08-20 DIAGNOSIS — Z915 Personal history of self-harm: Secondary | ICD-10-CM | POA: Insufficient documentation

## 2018-08-20 DIAGNOSIS — Z046 Encounter for general psychiatric examination, requested by authority: Secondary | ICD-10-CM | POA: Diagnosis present

## 2018-08-20 DIAGNOSIS — F250 Schizoaffective disorder, bipolar type: Secondary | ICD-10-CM

## 2018-08-20 LAB — COMPREHENSIVE METABOLIC PANEL
ALT: 39 U/L (ref 0–44)
ANION GAP: 11 (ref 5–15)
AST: 73 U/L — ABNORMAL HIGH (ref 15–41)
Albumin: 4.8 g/dL (ref 3.5–5.0)
Alkaline Phosphatase: 56 U/L (ref 38–126)
BUN: 10 mg/dL (ref 6–20)
CHLORIDE: 104 mmol/L (ref 98–111)
CO2: 26 mmol/L (ref 22–32)
CREATININE: 1.1 mg/dL (ref 0.61–1.24)
Calcium: 10 mg/dL (ref 8.9–10.3)
Glucose, Bld: 114 mg/dL — ABNORMAL HIGH (ref 70–99)
POTASSIUM: 3.6 mmol/L (ref 3.5–5.1)
SODIUM: 141 mmol/L (ref 135–145)
Total Bilirubin: 0.7 mg/dL (ref 0.3–1.2)
Total Protein: 8.5 g/dL — ABNORMAL HIGH (ref 6.5–8.1)

## 2018-08-20 LAB — RAPID URINE DRUG SCREEN, HOSP PERFORMED
AMPHETAMINES: NOT DETECTED
BARBITURATES: NOT DETECTED
Benzodiazepines: NOT DETECTED
Cocaine: NOT DETECTED
OPIATES: NOT DETECTED
TETRAHYDROCANNABINOL: NOT DETECTED

## 2018-08-20 LAB — CBC WITH DIFFERENTIAL/PLATELET
Basophils Absolute: 0 10*3/uL (ref 0.0–0.1)
Basophils Relative: 0 %
EOS ABS: 0.3 10*3/uL (ref 0.0–0.7)
EOS PCT: 3 %
HEMATOCRIT: 45.7 % (ref 39.0–52.0)
HEMOGLOBIN: 15.7 g/dL (ref 13.0–17.0)
LYMPHS PCT: 29 %
Lymphs Abs: 2.3 10*3/uL (ref 0.7–4.0)
MCH: 28.6 pg (ref 26.0–34.0)
MCHC: 34.4 g/dL (ref 30.0–36.0)
MCV: 83.4 fL (ref 78.0–100.0)
MONO ABS: 0.9 10*3/uL (ref 0.1–1.0)
Monocytes Relative: 12 %
Neutro Abs: 4.3 10*3/uL (ref 1.7–7.7)
Neutrophils Relative %: 56 %
Platelets: 302 10*3/uL (ref 150–400)
RBC: 5.48 MIL/uL (ref 4.22–5.81)
RDW: 12.7 % (ref 11.5–15.5)
WBC: 7.7 10*3/uL (ref 4.0–10.5)

## 2018-08-20 LAB — ETHANOL: Alcohol, Ethyl (B): <10 mg/dL (ref <10)

## 2018-08-20 LAB — RAPID HIV SCREEN (HIV 1/2 AB+AG)
HIV 1/2 Antibodies: NONREACTIVE
HIV-1 P24 ANTIGEN - HIV24: NONREACTIVE

## 2018-08-20 LAB — ACETAMINOPHEN LEVEL: Acetaminophen (Tylenol), Serum: <10 ug/mL — ABNORMAL LOW (ref 10–30)

## 2018-08-20 LAB — SALICYLATE LEVEL

## 2018-08-20 LAB — CK: CK TOTAL: 3719 U/L — AB (ref 49–397)

## 2018-08-20 LAB — VALPROIC ACID LEVEL

## 2018-08-20 MED ORDER — HYDROXYZINE HCL 25 MG PO TABS
25.0000 mg | ORAL_TABLET | Freq: Three times a day (TID) | ORAL | Status: DC | PRN
  Administered 2018-08-20 – 2018-08-21 (×2): 25 mg via ORAL
  Filled 2018-08-20 (×2): qty 1

## 2018-08-20 MED ORDER — ARIPIPRAZOLE 10 MG PO TABS
10.0000 mg | ORAL_TABLET | Freq: Every day | ORAL | Status: DC
  Administered 2018-08-20 – 2018-08-22 (×3): 10 mg via ORAL
  Filled 2018-08-20 (×3): qty 1

## 2018-08-20 MED ORDER — BENZTROPINE MESYLATE 0.5 MG PO TABS
0.5000 mg | ORAL_TABLET | Freq: Every day | ORAL | Status: DC
  Administered 2018-08-20 – 2018-08-22 (×3): 0.5 mg via ORAL
  Filled 2018-08-20 (×3): qty 1

## 2018-08-20 MED ORDER — STERILE WATER FOR INJECTION IJ SOLN
INTRAMUSCULAR | Status: AC
  Administered 2018-08-20: 1.2 mL via INTRAMUSCULAR
  Filled 2018-08-20: qty 10

## 2018-08-20 MED ORDER — LORAZEPAM 2 MG/ML IJ SOLN
2.0000 mg | Freq: Once | INTRAMUSCULAR | Status: AC
  Administered 2018-08-20: 2 mg via INTRAMUSCULAR
  Filled 2018-08-20: qty 1

## 2018-08-20 MED ORDER — SODIUM CHLORIDE 0.9 % IV BOLUS
3000.0000 mL | Freq: Once | INTRAVENOUS | Status: AC
  Administered 2018-08-20: 1000 mL via INTRAVENOUS

## 2018-08-20 MED ORDER — DIVALPROEX SODIUM 500 MG PO DR TAB: 500.0000 mg | DELAYED_RELEASE_TABLET | Freq: Every day | ORAL | Status: DC

## 2018-08-20 MED ORDER — ZIPRASIDONE MESYLATE 20 MG IM SOLR
20.0000 mg | Freq: Once | INTRAMUSCULAR | Status: AC
  Administered 2018-08-20: 20 mg via INTRAMUSCULAR
  Filled 2018-08-20: qty 20

## 2018-08-20 NOTE — ED Notes (Signed)
SBAR Report received from previous nurse. Pt received calm and visible on unit. Pt not current SI/ HI, A/V H, depression, anxiety, or pain at this time, and appears otherwise stable and free of distress. Pt reminded of camera surveillance, q 15 min rounds, and rules of the milieu. Will continue to assess.
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
## 2018-08-20 NOTE — ED Triage Notes (Signed)
Pt picked up by [employer] from home. Pt h/o schizophrenia and paranoia , has not taken medication in over a month. IVC papers present.
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
## 2018-08-20 NOTE — BH Assessment (Signed)
[facility] Assessment Progress Note Case was staffed this date with Shaune Pollack DNP who recommended a inpatient admission this date to assist with stabilization.
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
## 2018-08-21 DIAGNOSIS — Z9114 Patient's other noncompliance with medication regimen: Secondary | ICD-10-CM

## 2018-08-21 DIAGNOSIS — F17210 Nicotine dependence, cigarettes, uncomplicated: Secondary | ICD-10-CM

## 2018-08-21 MED ORDER — DIVALPROEX SODIUM 500 MG PO DR TAB
500.0000 mg | DELAYED_RELEASE_TABLET | Freq: Two times a day (BID) | ORAL | Status: DC
  Administered 2018-08-21 – 2018-08-22 (×2): 500 mg via ORAL
  Filled 2018-08-21: qty 1
  Filled 2018-08-21: qty 2

## 2018-08-21 MED ORDER — HYDROXYZINE HCL 25 MG PO TABS
25.0000 mg | ORAL_TABLET | Freq: Two times a day (BID) | ORAL | Status: DC
  Administered 2018-08-21 – 2018-08-22 (×2): 25 mg via ORAL
  Filled 2018-08-21 (×2): qty 1

## 2018-08-21 NOTE — Progress Notes (Signed)
This patient continues to meet inpatient criteria. CSW faxed information to the following facilities:   [facility]   Debrina Kizer, LCSW-A Clinical Social Worker [phone] 

## 2018-08-21 NOTE — ED Notes (Signed)
Patient calm, cooperative taking his po medications without difficulty.

## 2018-08-21 NOTE — Consult Note (Signed)
[facility] Consult   Reason for Consult: non-compliant with medication, agitation and aggressive behavior Referring Physician:  EDP Patient Identification: David [name] MRN:  [id number] Principal Diagnosis: Schizoaffective disorder, bipolar type [facility]) Diagnosis:   Patient Active Problem List   Diagnosis Date Noted  . Schizophrenia, paranoid ([place]) [F20.0] 07/15/2018  . Schizoaffective disorder, bipolar type ([place]) [F25.0] 05/28/2018  . Bipolar I disorder, most recent episode (or current) manic ([place]) [F31.10] 10/14/2017  . Psychosis ([place]) [F29] 10/13/2017  . Avulsion of left ear [S01.302A] 08/24/2016  . Multiple fractures of cervical spine (HCC) [S12.9XXA] 08/24/2016  . Mesenteric hematoma [S36.892A] 08/24/2016  . Serosal tear of colon [S36.539A] 08/24/2016  . Multiple abrasions [T07.XXXA] 08/24/2016  . Open fracture of right tibia and fibula [S82.201B, S82.401B] 08/24/2016  . Multiple closed tarsal fractures of left foot [S92.202A] 08/24/2016  . Acute blood loss anemia [D62] 08/24/2016  . Acute urinary retention [R33.8] 08/24/2016  . Blunt trauma [T14.90XA] 08/22/2016  . Aggressive behavior [R46.89]     Total Time spent with patient: 45 minutes  Subjective:   David [name] is a 22 y.o. male patient admitted with agitation and aggressive behavior  HPI: Patient with history of Bipolar disorder and Paranoid schizophrenia who was IVC'd by his mother due to non-compliant with medications, being combative, aggressive and easily agitated. Collateral information obtained from patient's mother revealed that he has not been maintaining good personal hygiene, socially withdrawn and has been talking to himself as if responding to internal stimuli.Mother reports that patient has been threatening to hurt her.  Past Psychiatric History: as above  Risk to Self: Suicidal Ideation: No Suicidal Intent: No Is patient at risk for suicide?: No Suicidal Plan?: No Access to Means:  No What has been your use of drugs/alcohol within the last 12 months?: UTA (Cannabis per hx) How many times?: 0(Per hx) Other Self Harm Risks: UTA Triggers for Past Attempts: Unknown Intentional Self Injurious Behavior: Cutting(Per hx) Comment - Self Injurious Behavior: Pt cut finger per last episode  Risk to Others: Homicidal Ideation: No Thoughts of Harm to Others: Yes-Currently Present Comment - Thoughts of Harm to Others: Threats to family per IVC Current Homicidal Intent: (UTA) Current Homicidal Plan: (UTA) Access to Homicidal Means: (UTA) Identified Victim: Family members per IVC History of harm to others?: Yes Assessment of Violence: On admission Violent Behavior Description: Threats aggression to staff on admission and family per IVC Does patient have access to weapons?: (UTA) Criminal Charges Pending?: (UTA) Does patient have a court date: (UTA) Prior Inpatient Therapy: Prior Inpatient Therapy: Yes Prior Therapy Dates: 2019, 2018 Prior Therapy Facilty/Provider(s): [facility], [facility], [facility] Reason for Treatment: MH issues Prior Outpatient Therapy: Prior Outpatient Therapy: Yes(Per IVC) Prior Therapy Dates: (UTA) Prior Therapy Facilty/Provider(s): (UTA) Reason for Treatment: (UTA) Does patient have an ACCT team?: No Does patient have Intensive In-House Services?  : No Does patient have [facility] services? : Unknown Does patient have P4CC services?: No  Past Medical History:  Past Medical History:  Diagnosis Date  . Psychosis ([place])   . Suicidal ideation     Past Surgical History:  Procedure Laterality Date  . APPLICATION OF WOUND VAC Right 08/23/2016   Procedure: WOUND VAC EXCHANGE;  Surgeon: Leandrew Koyanagi, MD;  Location: [facility];  Service: Orthopedics;  Laterality: Right;  . EXTERNAL FIXATION LEG  08/22/2016   Procedure: POSSIBLE EXTERNAL FIXATION LEG;  Surgeon: Greer Pickerel, MD;  Location: [facility];  Service: General;;  . EXTERNAL FIXATION REMOVAL Right 08/23/2016  Procedure:  REMOVAL EXTERNAL FIXATION LEG;  Surgeon: Leandrew Koyanagi, MD;  Location: [facility] [place];  Service: Orthopedics;  Laterality: Right;  . I&D EXTREMITY Right 08/22/2016   Procedure: IRRIGATION AND DEBRIDEMENT RIGHT LEG AND CASTING;  Surgeon: Greer Pickerel, MD;  Location: [facility];  Service: General;  Laterality: Right;  . LAPAROSCOPY N/A 08/22/2016   Procedure: LAPAROSCOPY DIAGNOSTIC;  Surgeon: Greer Pickerel, MD;  Location: [facility];  Service: General;  Laterality: N/A;  . LAPAROTOMY N/A 08/22/2016   Procedure: Exploratory LAPAROTOMY repair of right colon serosal tear - pre-op blunt abdominal trauma;  Surgeon: Greer Pickerel, MD;  Location: [facility];  Service: General;  Laterality: N/A;  . OTOPLASATY Left 08/22/2016   Procedure: REPAIR OF LEFT EAR;  Surgeon: Greer Pickerel, MD;  Location: [place];  Service: General;  Laterality: Left;  . TIBIA IM NAIL INSERTION Right 08/23/2016   Procedure: INTRAMEDULLARY (IM) NAIL TIBIAL;  Surgeon: Leandrew Koyanagi, MD;  Location: [facility];  Service: Orthopedics;  Laterality: Right;   Family History: No family history on file. Family Psychiatric  History:  Social History:  Social History   Substance and Sexual Activity  Alcohol Use Yes   Comment: last  drank beer 12-24-17     Social History   Substance and Sexual Activity  Drug Use No    Social History   Socioeconomic History  . Marital status: Single    Spouse name: Not on file  . Number of children: Not on file  . Years of education: Not on file  . Highest education level: Not on file  Occupational History  . Not on file  Social Needs  . Financial resource strain: Not on file  . Food insecurity:    Worry: Not on file    Inability: Not on file  . Transportation needs:    Medical: Not on file    Non-medical: Not on file  Tobacco Use  . Smoking status: Current Every Day Smoker    Packs/day: 1.00  . Smokeless tobacco: Never Used  Substance and Sexual Activity  . Alcohol use: Yes    Comment: last  drank beer 12-24-17  . Drug use: No  .  Sexual activity: Not on file  Lifestyle  . Physical activity:    Days per week: Not on file    Minutes per session: Not on file  . Stress: Not on file  Relationships  . Social connections:    Talks on phone: Not on file    Gets together: Not on file    Attends religious service: Not on file    Active member of club or organization: Not on file    Attends meetings of clubs or organizations: Not on file    Relationship status: Not on file  Other Topics Concern  . Not on file  Social History Narrative   ** Merged History Encounter **       Additional Social History:    Allergies:  No Known Allergies  Labs:  Results for orders placed or performed during the hospital encounter of 08/20/18 (from the past 48 hour(s))  Ethanol     Status: None   Collection Time: 08/20/18 12:43 PM  Result Value Ref Range   Alcohol, Ethyl (B) <10 <10 mg/dL    Comment: (NOTE) Lowest detectable limit for serum alcohol is 10 mg/dL. For medical purposes only. Performed at [facility], [place] [address], [place], [place] [address]   Salicylate level     Status: None  Collection Time: 08/20/18 12:43 PM  Result Value Ref Range   Salicylate Lvl <0.0 2.8 - 30.0 mg/dL    Comment: Performed at [facility], [place] [address] [place], [place] [address]  Acetaminophen level     Status: Abnormal   Collection Time: 08/20/18 12:43 PM  Result Value Ref Range   Acetaminophen (Tylenol), Serum <10 (L) 10 - 30 ug/mL    Comment: (NOTE) Therapeutic concentrations vary significantly. A range of 10-30 ug/mL  may be an effective concentration for many patients. However, some  are best treated at concentrations outside of this range. Acetaminophen concentrations >150 ug/mL at 4 hours after ingestion  and >50 ug/mL at 12 hours after ingestion are often associated with  toxic reactions. Performed at [facility], [place] [address], [place], [place] [address]   CBC  with Differential/Platelet     Status: None   Collection Time: 08/20/18 12:43 PM  Result Value Ref Range   WBC 7.7 4.0 - 10.5 K/uL   RBC 5.48 4.22 - 5.81 MIL/uL   Hemoglobin 15.7 13.0 - 17.0 g/dL   HCT 45.7 39.0 - 52.0 %   MCV 83.4 78.0 - 100.0 fL   MCH 28.6 26.0 - 34.0 pg   MCHC 34.4 30.0 - 36.0 g/dL   RDW 12.7 11.5 - 15.5 %   Platelets 302 150 - 400 K/uL   Neutrophils Relative % 56 %   Neutro Abs 4.3 1.7 - 7.7 K/uL   Lymphocytes Relative 29 %   Lymphs Abs 2.3 0.7 - 4.0 K/uL   Monocytes Relative 12 %   Monocytes Absolute 0.9 0.1 - 1.0 K/uL   Eosinophils Relative 3 %   Eosinophils Absolute 0.3 0.0 - 0.7 K/uL   Basophils Relative 0 %   Basophils Absolute 0.0 0.0 - 0.1 K/uL    Comment: Performed at [facility], [place] [address], [place], [place] [address]  Comprehensive metabolic panel     Status: Abnormal   Collection Time: 08/20/18 12:43 PM  Result Value Ref Range   Sodium 141 135 - 145 mmol/L   Potassium 3.6 3.5 - 5.1 mmol/L   Chloride 104 98 - 111 mmol/L   CO2 26 22 - 32 mmol/L   Glucose, Bld 114 (H) 70 - 99 mg/dL   BUN 10 6 - 20 mg/dL   Creatinine, Ser 1.10 0.61 - 1.24 mg/dL   Calcium 10.0 8.9 - 10.3 mg/dL   Total Protein 8.5 (H) 6.5 - 8.1 g/dL   Albumin 4.8 3.5 - 5.0 g/dL   AST 73 (H) 15 - 41 U/L   ALT 39 0 - 44 U/L   Alkaline Phosphatase 56 38 - 126 U/L   Total Bilirubin 0.7 0.3 - 1.2 mg/dL   GFR calc non Af Amer >60 >60 mL/min   GFR calc Af Amer >60 >60 mL/min    Comment: (NOTE) The eGFR has been calculated using the CKD EPI equation. This calculation has not been validated in all clinical situations. eGFR's persistently <60 mL/min signify possible Chronic Kidney Disease.    Anion gap 11 5 - 15    Comment: Performed at [facility], [place] [address], [place], [place] [address]  CK     Status: Abnormal   Collection Time: 08/20/18 12:43 PM  Result Value Ref Range   Total CK 3,719 (H) 49 - 397 U/L    Comment: Performed at  [facility], [place] [address] [place], [place] [address]  Valproic acid level     Status: Abnormal   Collection Time: 08/20/18 12:45 PM  Result Value Ref Range   Valproic Acid Lvl <10 (L) 50.0 - 100.0 ug/mL    Comment: RESULTS CONFIRMED BY MANUAL DILUTION Performed at [place] [address], [place], [place] [address]   Rapid urine drug screen (hospital performed)     Status: None   Collection Time: 08/20/18  1:30 PM  Result Value Ref Range   Opiates NONE DETECTED NONE DETECTED   Cocaine NONE DETECTED NONE DETECTED   Benzodiazepines NONE DETECTED NONE DETECTED   Amphetamines NONE DETECTED NONE DETECTED   Tetrahydrocannabinol NONE DETECTED NONE DETECTED   Barbiturates NONE DETECTED NONE DETECTED    Comment: (NOTE) DRUG SCREEN FOR MEDICAL PURPOSES ONLY.  IF CONFIRMATION IS NEEDED FOR ANY PURPOSE, NOTIFY LAB WITHIN 5 DAYS. LOWEST DETECTABLE LIMITS FOR URINE DRUG SCREEN Drug Class                     Cutoff (ng/mL) Amphetamine and metabolites    1000 Barbiturate and metabolites    200 Benzodiazepine                 425 Tricyclics and metabolites     300 Opiates and metabolites        300 Cocaine and metabolites        300 THC                            50 Performed at [facility], [place] [address], [place], [place] [address]   Rapid HIV screen (HIV 1/2 Ab+Ag)     Status: None   Collection Time: 08/20/18  1:58 PM  Result Value Ref Range   HIV-1 P24 Antigen - HIV24 NON REACTIVE NON REACTIVE   HIV 1/2 Antibodies NON REACTIVE NON REACTIVE   Interpretation (HIV Ag Ab)      A non reactive test result means that HIV 1 or HIV 2 antibodies and HIV 1 p24 antigen were not detected in the specimen.    Comment: RESULT CALLED TO, READ BACK BY AND VERIFIED WITH: TAI,M. RN AT 1455 08/20/18 MULLINS,T Performed at [facility], [place] [address] [place], [place] [address]     Current Facility-Administered  Medications  Medication Dose Route Frequency Provider Last Rate Last Dose  . ARIPiprazole (ABILIFY) tablet 10 mg  10 mg Oral Daily Lakitha Gordy, MD   10 mg at 08/21/18 0946  . benztropine (COGENTIN) tablet 0.5 mg  0.5 mg Oral Daily Lacretia Leigh, MD   0.5 mg at 08/21/18 0945  . divalproex (DEPAKOTE) DR tablet 500 mg  500 mg Oral BID Nolberto Cheuvront, MD      . hydrOXYzine (ATARAX/VISTARIL) tablet 25 mg  25 mg Oral BID Corena Pilgrim, MD       Current Outpatient Medications  Medication Sig Dispense Refill  . ARIPiprazole (ABILIFY) 10 MG tablet Take 1 tablet (10 mg total) by mouth daily. For mood control (Patient not taking: Reported on 08/20/2018) 30 tablet 0  . ARIPiprazole ER (ABILIFY MAINTENA) 400 MG SRER injection Inject 2 mLs (400 mg total) into the muscle every 28 (twenty-eight) days. (Due 08-17-18): For mood control (Patient not taking: Reported on 08/20/2018) 1 each 0  . benztropine (COGENTIN) 0.5 MG tablet Take 1 tablet (0.5 mg total) by mouth daily. For prevention of drug induced tremors (Patient not taking: Reported on 08/20/2018)  30 tablet 0  . divalproex (DEPAKOTE) 500 MG DR tablet Take 1 tablet (500 mg total) by mouth at bedtime. For mood stabilization (Patient not taking: Reported on 08/20/2018) 30 tablet 0  . hydrOXYzine (ATARAX/VISTARIL) 25 MG tablet Take 1 tablet (25 mg total) by mouth 3 (three) times daily as needed for anxiety. (Patient not taking: Reported on 08/20/2018) 30 tablet 0  . traZODone (DESYREL) 50 MG tablet Take 1 tablet (50 mg total) by mouth at bedtime as needed for sleep. (Patient not taking: Reported on 08/20/2018) 30 tablet 0    Musculoskeletal: Strength & Muscle Tone: within normal limits Gait & Station: normal Patient leans: N/A  Psychiatric Specialty Exam: Physical Exam  Psychiatric: His affect is blunt. His speech is delayed. He is agitated, aggressive and combative. Cognition and memory are normal. He expresses impulsivity. He expresses homicidal ideation.     Review of Systems  Constitutional: Negative.   HENT: Negative.   Eyes: Negative.   Respiratory: Negative.   Cardiovascular: Negative.   Gastrointestinal: Negative.   Genitourinary: Negative.   Musculoskeletal: Negative.   Skin: Negative.   Neurological: Negative.   Endo/Heme/Allergies: Negative.   Psychiatric/Behavioral: Positive for hallucinations.    Blood pressure 131/67, pulse 93, temperature 98.6 F (37 C), temperature source Oral, resp. rate 16, height '6\' 2"'$  (1.88 m), weight 81.6 kg, SpO2 97 %.Body mass index is 23.11 kg/m.  General Appearance: Casual  Eye Contact:  Minimal  Speech:  Slow  Volume:  Increased  Mood:  Irritable  Affect:  Labile  Thought Process:  Disorganized  Orientation:  Full (Time, Place, and Person)  Thought Content:  Illogical and Hallucinations: Auditory  Suicidal Thoughts:  No  Homicidal Thoughts:  Yes.  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 Add Vistaril 25 mg bid for anxiety/agitation/EPS prevention  Disposition: Recommend psychiatric Inpatient admission when medically cleared.  Corena Pilgrim, MD 08/21/2018 10:45 AM

## 2018-08-21 NOTE — ED Notes (Signed)
Pt sleeping at present, no distress noted, calm & cooperative at present.  Monitoring for safety, Q 15 min checks in effect. 

## 2018-08-21 NOTE — BH Assessment (Signed)
[facility] Assessment Progress Note    Patient denied by Yvetta Coder because of his acuity

## 2018-08-21 NOTE — BH Assessment (Signed)
[facility] Assessment Progress Note    Per Jamison Lord, NP, patient meets inpatient admission criteria   

## 2018-08-22 DIAGNOSIS — F250 Schizoaffective disorder, bipolar type: Secondary | ICD-10-CM

## 2018-08-22 LAB — HEPATITIS B SURFACE ANTIGEN: Hepatitis B Surface Ag: NEGATIVE

## 2018-08-22 LAB — HCV COMMENT:

## 2018-08-22 LAB — HEPATITIS C ANTIBODY (REFLEX): HCV Ab: <0.1 s/co ratio (ref 0.0–0.9)

## 2018-08-22 MED ORDER — TRAZODONE HCL 50 MG PO TABS: 50.0000 mg | ORAL_TABLET | Freq: Every evening | ORAL | 0 refills | Status: DC | PRN

## 2018-08-22 MED ORDER — ARIPIPRAZOLE ER 400 MG IM SRER
400.0000 mg | INTRAMUSCULAR | Status: DC
  Administered 2018-08-22: 400 mg via INTRAMUSCULAR
  Filled 2018-08-22: qty 2

## 2018-08-22 MED ORDER — ARIPIPRAZOLE 10 MG PO TABS: 10.0000 mg | ORAL_TABLET | Freq: Every day | ORAL | 0 refills | Status: DC

## 2018-08-22 MED ORDER — HYDROXYZINE HCL 25 MG PO TABS: 25.0000 mg | ORAL_TABLET | Freq: Two times a day (BID) | ORAL | 0 refills | Status: DC

## 2018-08-22 MED ORDER — BENZTROPINE MESYLATE 0.5 MG PO TABS: 0.5000 mg | ORAL_TABLET | Freq: Every day | ORAL | 0 refills | Status: DC

## 2018-08-22 MED ORDER — DIVALPROEX SODIUM 500 MG PO DR TAB: 500.0000 mg | DELAYED_RELEASE_TABLET | Freq: Two times a day (BID) | ORAL | 0 refills | Status: DC

## 2018-08-22 NOTE — ED Notes (Signed)
Bed: ZOX09 Expected date:  Expected time:  Means of arrival:  Comments: TCU PT

## 2018-08-22 NOTE — ED Notes (Signed)
RX X 5 GIVEN. BROTHER TYLER PRESENT AT DISCHARGE. DISCUSSED DISCHARGE WITH PERMISSION FROM PATIENT. DISCHARGED WITHOUT EVENT.

## 2018-08-22 NOTE — Discharge Instructions (Signed)
For your mental health needs, you are advised to follow up with [facility].  New and returning patients are seen at their walk-in clinic.  Walk-in hours are Monday - Friday from 8:00 am - 3:00 pm.  Walk-in patients are seen on a first come, first served basis.  Try to arrive as early as possible for he best chance of being seen the same day: ° °     [facility] °     [address] °     [place], [place] [address] °     [phone] °

## 2018-08-22 NOTE — BH Assessment (Signed)
[facility] Assessment Progress Note  Per Juanetta Beets, DO, this pt does not require psychiatric hospitalization at this time.  Pt presents under IVC initiated by the Mobile Crisis team, which Dr Sharma Covert has rescinded.  Pt is to be discharged from [facility] with recommendation to follow up with [facility].  This has been included in pt's discharge instructions.  Pt's nurse, Morrie Sheldon, has been notified.  Doylene Canning, MA Triage Specialist [phone]

## 2018-08-22 NOTE — Consult Note (Addendum)
[facility] ED Discharge  08/22/2018 10:45 AM LEAR MARTINETTI  MRN:  [id number] Principal Problem: Schizoaffective disorder, bipolar type [facility]) Discharge Diagnoses:  Patient Active Problem List   Diagnosis Date Noted  . Schizoaffective disorder, bipolar type (HCC) [F25.0] 05/28/2018    Priority: High  . 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 Bipolar I disorder, most recent episode (or current) manic (HCC) [F31.10] 10/14/2017  . Psychosis (HCC) [F29] 10/13/2017  . Avulsion of left ear [S01.302A] 08/24/2016  . Multiple fractures of cervical spine (HCC) [S12.9XXA] 08/24/2016  . Mesenteric hematoma [S36.892A] 08/24/2016  . Serosal tear of colon [S36.539A] 08/24/2016  . Multiple abrasions [T07.XXXA] 08/24/2016  . Open fracture of right tibia and fibula [S82.201B, S82.401B] 08/24/2016  . Multiple closed tarsal fractures of left foot [S92.202A] 08/24/2016  . Acute blood loss anemia [D62] 08/24/2016  . Acute urinary retention [R33.8] 08/24/2016  . Blunt trauma [T14.90XA] 08/22/2016  . Aggressive behavior [R46.89]     Subjective: 22 yo male who presented to the ED with psychosis after not taking his medications.  Medications were restarted and he is now stable.  Calm, cooperative with no suicidal/homicidal ideations, hallucination, or substance abuse.  Compliant with his medications which decreases his psychosis and increases without these.  Stable for discharge.  Total Time spent with patient: 30 minutes  Past Psychiatric History: schizoaffective disorder  Past Medical History:  Past Medical History:  Diagnosis Date  . Psychosis (HCC)   . Suicidal ideation    Past Surgical History:  Procedure Laterality Date  . APPLICATION OF WOUND VAC Right 08/23/2016   Procedure: WOUND VAC EXCHANGE;  Surgeon: Tarry Kos, MD;  Location: [facility] OR;  Service: Orthopedics;  Laterality: Right;  . EXTERNAL FIXATION LEG  08/22/2016   Procedure: POSSIBLE EXTERNAL FIXATION LEG;  Surgeon: Gaynelle Adu,  MD;  Location[facility] OR;  Service: General;;  . EXTERNAL FIXATION REMOVAL Right 08/23/2016   Procedure: REMOVAL EXTERNAL FIXATION LEG;  Surgeon: Tarry Kos, MD;  Location: [facility] OR;  Service: Orthopedics;  Laterality: Right;  . I&D EXTREMITY Right 08/22/2016   Procedure: IRRIGATION AND DEBRIDEMENT RIGHT LEG AND CASTING;  Surgeon: Gaynelle Adu, MD;  Location[facility] OR;  Service: General;  Laterality: Right;  . LAPAROSCOPY N/A 08/22/2016   Procedure: LAPAROSCOPY DIAGNOSTIC;  Surgeon: Gaynelle Adu, MD;  Location: [facility] OR;  Service: General;  Laterality: N/A;  . LAPAROTOMY N/A 08/22/2016   Procedure: Exploratory LAPAROTOMY repair of right colon serosal tear - pre-op blunt abdominal trauma;  Surgeon: Gaynelle Adu, MD;  Location: [facility] OR;  Service: General;  Laterality: N/A;  . OTOPLASATY Left 08/22/2016   Procedure: REPAIR OF LEFT EAR;  Surgeon: Gaynelle Adu, MD;  Location: [facility] OR;  Service: General;  Laterality: Left;  . TIBIA IM NAIL INSERTION Right 08/23/2016   Procedure: INTRAMEDULLARY (IM) NAIL TIBIAL;  Surgeon: Tarry Kos, MD;  Location: [facility] OR;  Service: Orthopedics;  Laterality: Right;   Family History: No family history on file. Family Psychiatric  History: none Social History:  Social History   Substance and Sexual Activity  Alcohol Use Yes   Comment: last  drank beer 12-24-17    Social History   Substance and Sexual Activity  Drug Use No   Social History   Socioeconomic History  . Marital status: Single    Spouse name: Not on file  . Number of children: Not on file  . Years of education: Not on file  . Highest education level: Not on file  Occupational History  .  Not on file  Social Needs  . Financial resource strain: Not on file  . Food insecurity:    Worry: Not on file    Inability: Not on file  . Transportation needs:    Medical: Not on file    Non-medical: Not on file  Tobacco Use  . Smoking status: Current Every Day Smoker    Packs/day: 1.00  . Smokeless tobacco: Never Used   Substance and Sexual Activity  . Alcohol use: Yes    Comment: last  drank beer 12-24-17  . Drug use: No  . Sexual activity: Not on file  Lifestyle  . Physical activity:    Days per week: Not on file    Minutes per session: Not on file  . Stress: Not on file  Relationships  . Social connections:    Talks on phone: Not on file    Gets together: Not on file    Attends religious service: Not on file    Active member of club or organization: Not on file    Attends meetings of clubs or organizations: Not on file    Relationship status: Not on file  Other Topics Concern  . Not on file  Social History Narrative   ** Merged History Encounter **        Has this patient used any form of tobacco in the last 30 days? (Cigarettes, Smokeless Tobacco, Cigars, and/or Pipes) N/A  Current Medications: Current Facility-Administered Medications  Medication Dose Route Frequency Provider Last Rate Last Dose  . ARIPiprazole (ABILIFY) tablet 10 mg  10 mg Oral Daily Akintayo, Mojeed, MD   10 mg at 08/22/18 1002  . benztropine (COGENTIN) tablet 0.5 mg  0.5 mg Oral Daily Lorre Nick, MD   0.5 mg at 08/22/18 1002  . divalproex (DEPAKOTE) DR tablet 500 mg  500 mg Oral BID Thedore Mins, MD   500 mg at 08/22/18 0958  . hydrOXYzine (ATARAX/VISTARIL) tablet 25 mg  25 mg Oral BID Thedore Mins, MD   25 mg at 08/22/18 1002   Current Outpatient Medications  Medication Sig Dispense Refill  . ARIPiprazole (ABILIFY) 10 MG tablet Take 1 tablet (10 mg total) by mouth daily. For mood control (Patient not taking: Reported on 08/20/2018) 30 tablet 0  . ARIPiprazole ER (ABILIFY MAINTENA) 400 MG SRER injection Inject 2 mLs (400 mg total) into the muscle every 28 (twenty-eight) days. (Due 08-17-18): For mood control (Patient not taking: Reported on 08/20/2018) 1 each 0  . benztropine (COGENTIN) 0.5 MG tablet Take 1 tablet (0.5 mg total) by mouth daily. For prevention of drug induced tremors (Patient not taking:  Reported on 08/20/2018) 30 tablet 0  . divalproex (DEPAKOTE) 500 MG DR tablet Take 1 tablet (500 mg total) by mouth at bedtime. For mood stabilization (Patient not taking: Reported on 08/20/2018) 30 tablet 0  . hydrOXYzine (ATARAX/VISTARIL) 25 MG tablet Take 1 tablet (25 mg total) by mouth 3 (three) times daily as needed for anxiety. (Patient not taking: Reported on 08/20/2018) 30 tablet 0  . traZODone (DESYREL) 50 MG tablet Take 1 tablet (50 mg total) by mouth at bedtime as needed for sleep. (Patient not taking: Reported on 08/20/2018) 30 tablet 0   PTA Medications:  (Not in a hospital admission)  Musculoskeletal: Strength & Muscle Tone: within normal limits Gait & Station: normal Patient leans: N/A  Psychiatric Specialty Exam: Physical Exam  Nursing note and vitals reviewed. Constitutional: He is oriented to person, place, and time. He appears well-developed and  well-nourished.  HENT:  Head: Normocephalic.  Neck: Normal range of motion.  Respiratory: Effort normal.  Musculoskeletal: Normal range of motion.  Neurological: He is alert and oriented to person, place, and time.  Psychiatric: His speech is normal and behavior is normal. Judgment and thought content normal. His affect is blunt. Cognition and memory are normal.    Review of Systems  Psychiatric/Behavioral: Negative for suicidal ideas.  All other systems reviewed and are negative.   Blood pressure 131/79, pulse 80, temperature 99.1 F (37.3 C), temperature source Oral, resp. rate 16, height 6\' 2"  (1.88 m), weight 81.6 kg, SpO2 99 %.Body mass index is 23.11 kg/m.  General Appearance: Casual  Eye Contact:  Good  Speech:  Normal Rate  Volume:  Normal  Mood:  Euthymic  Affect:  Congruent  Thought Process:  Coherent and Descriptions of Associations: Intact  Orientation:  Full (Time, Place, and Person)  Thought Content:  WDL and Logical  Suicidal Thoughts:  No  Homicidal Thoughts:  No  Memory:  Immediate;   Good Recent;    Good Remote;   Good  Judgement:  Fair  Insight:  Fair  Psychomotor Activity:  Normal  Concentration:  Concentration: Good and Attention Span: Good  Recall:  Good  Fund of Knowledge:  Fair  Language:  Good  Akathisia:  No  Handed:  Right  AIMS (if indicated):   N/A  Assets:  Housing Leisure Time Physical Health Resilience Social Support  ADL's:  Intact  Cognition:  WNL  Sleep:   N/A     Demographic Factors:  Adolescent or young adult  Loss Factors: NA  Historical Factors: NA  Risk Reduction Factors:   Sense of responsibility to family, Living with another person, especially a relative, Positive social support and Positive therapeutic relationship  Continued Clinical Symptoms:  None   Cognitive Features That Contribute To Risk:  None    Suicide Risk:  Minimal: No identifiable suicidal ideation.  Patients presenting with no risk factors but with morbid ruminations; may be classified as minimal risk based on the severity of the depressive symptoms    Plan Of Care/Follow-up recommendations:  Activity:  as tolerated Diet:  heart healthy diet  Disposition: discharge home Nanine Means, NP 08/22/2018, 10:45 AM   Patient seen face-to-face for psychiatric evaluation, chart reviewed and case discussed with the physician extender and developed treatment plan. Reviewed the information documented and agree with the treatment plan.  Juanetta Beets, DO 08/22/18 2:52 PM

## 2018-08-22 NOTE — ED Notes (Signed)
MOTHER SARAH HENDERSON RETURNED CALL. ATTEMPTING TO MAKE ARRANGEMENTS TO PICK UP PATIENT. SHE STATES SHE WILL CALL BACK SHORTLY. INFORMED MOTHER PATIENT WILL BE DISCHARGED SHORTLY.

## 2018-08-22 NOTE — ED Notes (Signed)
TTS AT BEDSIDE 

## 2020-06-08 ENCOUNTER — Other Ambulatory Visit: Payer: Self-pay

## 2020-06-08 ENCOUNTER — Ambulatory Visit (HOSPITAL_COMMUNITY)
Admission: EM | Admit: 2020-06-08 | Discharge: 2020-06-08 | Discharge status: Against Medical Advice | Payer: Medicaid Other

## 2020-06-08 NOTE — Discharge Instructions (Signed)
You have an intake appointment scheduled with [facility] on 7/2.

## 2020-06-08 NOTE — ED Notes (Signed)
Pt refused to be seen by nursing staff for medical clearance. Pt sat in lobby, and shook his head and said "no" when asked 2 different times if he would allow this nurse to get his vitals. Mom tried to encourage him to allow medical staff to assess pt. Advised mom that medical staff could not force pt to be seen.
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
## 2020-06-14 ENCOUNTER — Other Ambulatory Visit: Payer: Self-pay

## 2020-06-14 ENCOUNTER — Telehealth (HOSPITAL_COMMUNITY): Payer: Medicaid Other | Admitting: Psychiatric/Mental Health

## 2020-08-09 ENCOUNTER — Encounter (HOSPITAL_COMMUNITY): Payer: Self-pay | Admitting: Emergency Medicine

## 2020-08-09 ENCOUNTER — Emergency Department (HOSPITAL_COMMUNITY)
Admission: EM | Admit: 2020-08-09 | Discharge: 2020-08-11 | Disposition: A | Discharge status: Other Acute Inpatient Hospital | Payer: Medicaid Other | Attending: Emergency Medicine | Admitting: Emergency Medicine

## 2020-08-09 DIAGNOSIS — Z20822 Contact with and (suspected) exposure to covid-19: Secondary | ICD-10-CM | POA: Insufficient documentation

## 2020-08-09 DIAGNOSIS — F209 Schizophrenia, unspecified: Secondary | ICD-10-CM | POA: Insufficient documentation

## 2020-08-09 DIAGNOSIS — F250 Schizoaffective disorder, bipolar type: Secondary | ICD-10-CM | POA: Diagnosis present

## 2020-08-09 DIAGNOSIS — F17200 Nicotine dependence, unspecified, uncomplicated: Secondary | ICD-10-CM | POA: Insufficient documentation

## 2020-08-09 DIAGNOSIS — Z79899 Other long term (current) drug therapy: Secondary | ICD-10-CM | POA: Insufficient documentation

## 2020-08-09 DIAGNOSIS — F319 Bipolar disorder, unspecified: Secondary | ICD-10-CM | POA: Insufficient documentation

## 2020-08-09 LAB — COMPREHENSIVE METABOLIC PANEL
ALT: 17 U/L (ref 0–44)
AST: 19 U/L (ref 15–41)
Albumin: 4.8 g/dL (ref 3.5–5.0)
Alkaline Phosphatase: 47 U/L (ref 38–126)
Anion gap: 13 (ref 5–15)
BUN: 11 mg/dL (ref 6–20)
CO2: 27 mmol/L (ref 22–32)
Calcium: 10 mg/dL (ref 8.9–10.3)
Chloride: 102 mmol/L (ref 98–111)
Creatinine, Ser: 0.88 mg/dL (ref 0.61–1.24)
GFR calc Af Amer: >60 mL/min (ref >60)
GFR calc non Af Amer: >60 mL/min (ref >60)
Glucose, Bld: 114 mg/dL — ABNORMAL HIGH (ref 70–99)
Potassium: 4.2 mmol/L (ref 3.5–5.1)
Sodium: 142 mmol/L (ref 135–145)
Total Bilirubin: 0.8 mg/dL (ref 0.3–1.2)
Total Protein: 8.1 g/dL (ref 6.5–8.1)

## 2020-08-09 LAB — CBC WITH DIFFERENTIAL/PLATELET
Abs Immature Granulocytes: 0.02 10*3/uL (ref 0.00–0.07)
Basophils Absolute: 0.1 10*3/uL (ref 0.0–0.1)
Basophils Relative: 1 %
Eosinophils Absolute: 0.3 10*3/uL (ref 0.0–0.5)
Eosinophils Relative: 4 %
HCT: 46 % (ref 39.0–52.0)
Hemoglobin: 15.3 g/dL (ref 13.0–17.0)
Immature Granulocytes: 0 %
Lymphocytes Relative: 27 %
Lymphs Abs: 1.9 10*3/uL (ref 0.7–4.0)
MCH: 27.5 pg (ref 26.0–34.0)
MCHC: 33.3 g/dL (ref 30.0–36.0)
MCV: 82.7 fL (ref 80.0–100.0)
Monocytes Absolute: 0.6 10*3/uL (ref 0.1–1.0)
Monocytes Relative: 8 %
Neutro Abs: 4.3 10*3/uL (ref 1.7–7.7)
Neutrophils Relative %: 60 %
Platelets: 281 10*3/uL (ref 150–400)
RBC: 5.56 MIL/uL (ref 4.22–5.81)
RDW: 12 % (ref 11.5–15.5)
WBC: 7.1 10*3/uL (ref 4.0–10.5)
nRBC: 0 % (ref 0.0–0.2)

## 2020-08-09 LAB — ETHANOL: Alcohol, Ethyl (B): <10 mg/dL (ref <10)

## 2020-08-09 MED ORDER — ACETAMINOPHEN 325 MG PO TABS: 650.0000 mg | ORAL_TABLET | ORAL | Status: DC | PRN

## 2020-08-09 MED ORDER — ZIPRASIDONE MESYLATE 20 MG IM SOLR
20.0000 mg | Freq: Once | INTRAMUSCULAR | Status: AC | PRN
Start: 2020-08-09 — End: 2020-08-09
  Administered 2020-08-09: 20 mg via INTRAMUSCULAR
  Filled 2020-08-09: qty 20

## 2020-08-09 MED ORDER — LORAZEPAM 1 MG PO TABS: 2.0000 mg | ORAL_TABLET | Freq: Once | ORAL | Status: DC

## 2020-08-09 MED ORDER — STERILE WATER FOR INJECTION IJ SOLN
INTRAMUSCULAR | Status: AC
  Filled 2020-08-09: qty 10

## 2020-08-09 MED ORDER — LORAZEPAM 2 MG/ML IJ SOLN
2.0000 mg | Freq: Once | INTRAMUSCULAR | Status: AC
  Administered 2020-08-09: 2 mg via INTRAMUSCULAR
  Filled 2020-08-09: qty 1

## 2020-08-09 NOTE — ED Notes (Signed)
Patient is resting comfortably. 

## 2020-08-09 NOTE — ED Notes (Signed)
Patient received food at this time.

## 2020-08-09 NOTE — ED Notes (Signed)
Assumed care of patient at this time, nad noted, sr up x2, bed locked and low.  Will continue to monitor.  Sitter at the bedside.

## 2020-08-09 NOTE — BH Assessment (Signed)
Clinician called to [facility] secretary to see if pt could be assessed.  David [name] said that pt was still sleeping comfortably.  Pt had been given Ativan IM 2mg  at 18:39.
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
## 2020-08-09 NOTE — ED Notes (Signed)
Refused blood work.

## 2020-08-10 LAB — SARS CORONAVIRUS 2 BY RT PCR (HOSPITAL ORDER, PERFORMED IN ~~LOC~~ HOSPITAL LAB): SARS Coronavirus 2: NEGATIVE

## 2020-08-10 NOTE — ED Notes (Signed)
Patient sitting up playing video game on his telephone.

## 2020-08-10 NOTE — Progress Notes (Signed)
Pt accepted to [facility], Room 501-02 to the service of MD [facility], pending a negative COVID result.  Report may be called to [phone] when transportation has been arranged.  Patient may come after 1930 hours.
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
## 2020-08-10 NOTE — ED Notes (Signed)
Attempted to call report, staff still in report and will call back

## 2020-08-10 NOTE — Progress Notes (Signed)
Patient meets criteria for inpatient treatment. There are no available beds at [facility] currently. CSW faxed referrals to the following facilities for review:  [facility]  [facility]  TTS will continue to seek bed placement.   Trula Slade, MSW, LCSW Clinical Social Worker 08/10/2020 4:47 PM

## 2020-08-11 ENCOUNTER — Other Ambulatory Visit: Payer: Self-pay

## 2020-08-11 DIAGNOSIS — F250 Schizoaffective disorder, bipolar type: Secondary | ICD-10-CM | POA: Diagnosis not present

## 2020-08-11 LAB — VALPROIC ACID LEVEL: Valproic Acid Lvl: <10 ug/mL — ABNORMAL LOW (ref 50.0–100.0)

## 2020-08-11 MED ORDER — ARIPIPRAZOLE 10 MG PO TABS
10.0000 mg | ORAL_TABLET | Freq: Every day | ORAL | Status: DC
  Administered 2020-08-11: 10 mg via ORAL
  Filled 2020-08-11: qty 1

## 2020-08-11 MED ORDER — ARIPIPRAZOLE ER 400 MG IM SRER
400.0000 mg | INTRAMUSCULAR | Status: DC
  Administered 2020-08-11: 400 mg via INTRAMUSCULAR
  Filled 2020-08-11: qty 2

## 2020-08-11 MED ORDER — DIVALPROEX SODIUM 500 MG PO DR TAB
500.0000 mg | DELAYED_RELEASE_TABLET | Freq: Every day | ORAL | Status: DC
  Administered 2020-08-11: 500 mg via ORAL
  Filled 2020-08-11: qty 1

## 2020-08-11 MED ORDER — BENZTROPINE MESYLATE 1 MG PO TABS
1.0000 mg | ORAL_TABLET | Freq: Two times a day (BID) | ORAL | Status: DC
  Administered 2020-08-11 (×2): 1 mg via ORAL
  Filled 2020-08-11 (×2): qty 1
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
## 2020-08-11 NOTE — ED Notes (Signed)
Pt.s lunch tray has arrived, left on table because pt not hungry at the moment. Will continue to monitor pt.

## 2020-08-11 NOTE — Progress Notes (Signed)
Pt has been accepted to the service of [facility] and assigned to Room 505-02.  Report may be called to [phone] when transportation has been arranged.  Pt may come at 1930 hours or after.

## 2020-08-11 NOTE — ED Notes (Signed)
Patient's parents have attempted to call and ask for updates. RN asked patient if I could update his parents and patient shook his head "no". RN asked patient if he would like to talk to his parents and he shook his head "no" as well.
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
## 2020-08-11 NOTE — ED Notes (Signed)
Pt.s breakfast has arrived. Told pt that the breakfast tray is on table, pt simply looked at breakfast tray and went back to bed. Repeated to pt that the breakfast tray is on his bedside table, pt did not respond. Told pt that if he felt like eating his breakfast is on his table. Will continue to monitor pt.
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
## 2020-08-11 NOTE — ED Notes (Signed)
Pt spoke to the TTS cart. Will continue to monitor pt.

## 2020-08-11 NOTE — ED Notes (Signed)
Pt had an EKG done, pt was compliant and cooperative throughout the entire time. Will continue to monitor pt.

## 2020-08-11 NOTE — ED Notes (Signed)
Pt walked to the bathroom, without any problems or difficulty. Asked for 2 warm blankets, given to pt. Will continue to monitor pt.

## 2020-08-11 NOTE — ED Notes (Signed)
David [name], father, [phone] would like to speak with TTS about his condition and getting him help.

## 2020-08-12 ENCOUNTER — Other Ambulatory Visit: Payer: Self-pay

## 2020-08-12 ENCOUNTER — Inpatient Hospital Stay (HOSPITAL_COMMUNITY)
Admission: AD | Admit: 2020-08-12 | Discharge: 2020-08-22 | DRG: 885 | Disposition: A | Discharge status: Home / Self Care | Payer: Medicaid Other | Source: Intra-hospital | Attending: Psychiatry | Admitting: Psychiatry

## 2020-08-12 ENCOUNTER — Encounter (HOSPITAL_COMMUNITY): Payer: Self-pay | Admitting: Adult Health

## 2020-08-12 DIAGNOSIS — F200 Paranoid schizophrenia: Secondary | ICD-10-CM | POA: Diagnosis not present

## 2020-08-12 DIAGNOSIS — F17210 Nicotine dependence, cigarettes, uncomplicated: Secondary | ICD-10-CM | POA: Diagnosis present

## 2020-08-12 DIAGNOSIS — Z79899 Other long term (current) drug therapy: Secondary | ICD-10-CM | POA: Diagnosis not present

## 2020-08-12 DIAGNOSIS — G4700 Insomnia, unspecified: Secondary | ICD-10-CM | POA: Diagnosis present

## 2020-08-12 DIAGNOSIS — F940 Selective mutism: Secondary | ICD-10-CM | POA: Diagnosis present

## 2020-08-12 DIAGNOSIS — R4689 Other symptoms and signs involving appearance and behavior: Secondary | ICD-10-CM | POA: Diagnosis present

## 2020-08-12 DIAGNOSIS — F250 Schizoaffective disorder, bipolar type: Secondary | ICD-10-CM | POA: Diagnosis present

## 2020-08-12 DIAGNOSIS — F419 Anxiety disorder, unspecified: Secondary | ICD-10-CM | POA: Diagnosis present

## 2020-08-12 DIAGNOSIS — F209 Schizophrenia, unspecified: Secondary | ICD-10-CM | POA: Diagnosis present

## 2020-08-12 MED ORDER — HYDROXYZINE HCL 25 MG PO TABS
25.0000 mg | ORAL_TABLET | Freq: Three times a day (TID) | ORAL | Status: DC | PRN
  Administered 2020-08-15: 25 mg via ORAL
  Filled 2020-08-12 (×3): qty 1

## 2020-08-12 MED ORDER — RISPERIDONE 2 MG PO TBDP
2.0000 mg | ORAL_TABLET | Freq: Three times a day (TID) | ORAL | Status: DC | PRN
  Filled 2020-08-12: qty 1

## 2020-08-12 MED ORDER — DIPHENHYDRAMINE HCL 25 MG PO CAPS
50.0000 mg | ORAL_CAPSULE | Freq: Four times a day (QID) | ORAL | Status: DC | PRN
  Administered 2020-08-13 – 2020-08-15 (×2): 50 mg via ORAL
  Filled 2020-08-12 (×3): qty 2

## 2020-08-12 MED ORDER — ALUM & MAG HYDROXIDE-SIMETH 200-200-20 MG/5ML PO SUSP: 30.0000 mL | ORAL | Status: DC | PRN

## 2020-08-12 MED ORDER — DIVALPROEX SODIUM 500 MG PO DR TAB
500.0000 mg | DELAYED_RELEASE_TABLET | Freq: Two times a day (BID) | ORAL | Status: DC
  Administered 2020-08-12 – 2020-08-16 (×6): 500 mg via ORAL
  Filled 2020-08-12 (×14): qty 1

## 2020-08-12 MED ORDER — TRAZODONE HCL 50 MG PO TABS
50.0000 mg | ORAL_TABLET | Freq: Every evening | ORAL | Status: DC | PRN
  Administered 2020-08-15 – 2020-08-16 (×2): 50 mg via ORAL
  Filled 2020-08-12 (×3): qty 1

## 2020-08-12 MED ORDER — ARIPIPRAZOLE 10 MG PO TABS
10.0000 mg | ORAL_TABLET | Freq: Every day | ORAL | Status: DC
  Filled 2020-08-12 (×3): qty 1

## 2020-08-12 MED ORDER — MAGNESIUM HYDROXIDE 400 MG/5ML PO SUSP: 30.0000 mL | Freq: Every day | ORAL | Status: DC | PRN

## 2020-08-12 MED ORDER — DIPHENHYDRAMINE HCL 50 MG/ML IJ SOLN: 50.0000 mg | Freq: Four times a day (QID) | INTRAMUSCULAR | Status: DC | PRN

## 2020-08-12 MED ORDER — RISPERIDONE 2 MG PO TBDP
2.0000 mg | ORAL_TABLET | Freq: Two times a day (BID) | ORAL | Status: DC
  Filled 2020-08-12 (×3): qty 1

## 2020-08-12 MED ORDER — LORAZEPAM 1 MG PO TABS
2.0000 mg | ORAL_TABLET | Freq: Four times a day (QID) | ORAL | Status: DC | PRN
  Filled 2020-08-12: qty 2

## 2020-08-12 MED ORDER — HALOPERIDOL LACTATE 5 MG/ML IJ SOLN: 5.0000 mg | Freq: Four times a day (QID) | INTRAMUSCULAR | Status: DC | PRN

## 2020-08-12 MED ORDER — HALOPERIDOL 5 MG PO TABS: 5.0000 mg | ORAL_TABLET | Freq: Four times a day (QID) | ORAL | Status: DC | PRN

## 2020-08-12 MED ORDER — LORAZEPAM 1 MG PO TABS: 1.0000 mg | ORAL_TABLET | ORAL | Status: DC | PRN

## 2020-08-12 MED ORDER — HALOPERIDOL 5 MG PO TABS
10.0000 mg | ORAL_TABLET | Freq: Two times a day (BID) | ORAL | Status: DC
  Administered 2020-08-12: 10 mg via ORAL
  Filled 2020-08-12 (×8): qty 2

## 2020-08-12 MED ORDER — LORAZEPAM 2 MG/ML IJ SOLN: 2.0000 mg | Freq: Four times a day (QID) | INTRAMUSCULAR | Status: DC | PRN

## 2020-08-12 MED ORDER — ZIPRASIDONE MESYLATE 20 MG IM SOLR: 20.0000 mg | INTRAMUSCULAR | Status: DC | PRN

## 2020-08-12 MED ORDER — HALOPERIDOL LACTATE 5 MG/ML IJ SOLN
10.0000 mg | Freq: Two times a day (BID) | INTRAMUSCULAR | Status: DC
  Administered 2020-08-13 – 2020-08-14 (×2): 10 mg via INTRAMUSCULAR
  Filled 2020-08-12 (×8): qty 2

## 2020-08-12 MED ORDER — ARIPIPRAZOLE ER 400 MG IM SRER
400.0000 mg | INTRAMUSCULAR | Status: DC
  Administered 2020-08-12: 400 mg via INTRAMUSCULAR

## 2020-08-12 MED ORDER — ACETAMINOPHEN 325 MG PO TABS: 650.0000 mg | ORAL_TABLET | Freq: Four times a day (QID) | ORAL | Status: DC | PRN
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
## 2020-08-12 NOTE — Clinical Social Work Note (Addendum)
Left v/m message for mother, Ms Orson Aloe, at [phone].   Left v/m message for brother, Jessi Jessop, [phone].

## 2020-08-12 NOTE — Progress Notes (Signed)
Recreation Therapy Notes  Date: 8.30.21 Time: 1000 Location: 500 Hall Dayroom  Group Topic: Coping Skills  Goal Area(s) Addresses:  Patient will identify positive coping strategies. Patient will identify benefit of using positive coping strategies.  Intervention: Worksheet, Programmer, multimedia  Activity: Mind Map.  LRT and patients filled out the first 8 boxes (anger, grief/guilt, sorrow, laziness, loneliness, depression, anxiety and pain).  Patients were then given the time to come up with at three coping skills for each situation.  LRT would then write the coping skills on the board with the corresponding situation.  Education: Pharmacologist, Building control surveyor.   Education Outcome: Acknowledges understanding/In group clarification offered/Needs additional education.   Clinical Observations/Feedback: Pt did not attend group session.    [name], LRT/CTRS         [name] A 08/12/2020 11:57 AM
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
## 2020-08-12 NOTE — Progress Notes (Addendum)
Pt given PO Haldol and Depakote per MAR , pt did not appear to swallow the medication and when asked to show writer his mouth he walked away from the medication window. Pt asked to show writer his mouth and pt refused. Pt may need to be given IM Haldol until pt becomes compliant with PO medications. Writer sat with pt for 10 minutes after given medications

## 2020-08-12 NOTE — Progress Notes (Signed)
Psychoeducational Group Note  Date:  08/12/2020 Time:  2035  Group Topic/Focus:  Wrap-Up Group:   The focus of this group is to help patients review their daily goal of treatment and discuss progress on daily workbooks.  Participation Level: Did Not Attend  Participation Quality:  Not Applicable  Affect:  Not Applicable  Cognitive:  Not Applicable  Insight:  Not Applicable  Engagement in Group: Not Applicable  Additional Comments:  Patient did not attend group this evening.   Hazle Coca S 08/12/2020, 8:35 PM
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
## 2020-08-12 NOTE — Tx Team (Signed)
Interdisciplinary Treatment and Diagnostic Plan Update  08/12/2020 Time of Session: 0918 SARP VERNIER MRN: [id number]  Principal Diagnosis: <principal problem not specified>  Secondary Diagnoses: Active Problems:   Schizophrenia (HCC)   Current Medications:  Current Facility-Administered Medications  Medication Dose Route Frequency Provider Last Rate Last Admin  . acetaminophen (TYLENOL) tablet 650 mg  650 mg Oral Q6H PRN Money, Gerlene Burdock, FNP      . alum & mag hydroxide-simeth (MAALOX/MYLANTA) 200-200-20 MG/5ML suspension 30 mL  30 mL Oral Q4H PRN Money, Gerlene Burdock, FNP      . ARIPiprazole ER (ABILIFY MAINTENA) injection 400 mg  400 mg Intramuscular Q28 days Antonieta Pert, MD      . diphenhydrAMINE (BENADRYL) capsule 50 mg  50 mg Oral Q6H PRN Money, Gerlene Burdock, FNP       Or  . diphenhydrAMINE (BENADRYL) injection 50 mg  50 mg Intramuscular Q6H PRN Money, Feliz Beam B, FNP      . divalproex (DEPAKOTE) DR tablet 500 mg  500 mg Oral Q12H Money, Feliz Beam B, FNP      . haloperidol (HALDOL) tablet 10 mg  10 mg Oral BID Antonieta Pert, MD       Or  . haloperidol lactate (HALDOL) injection 10 mg  10 mg Intramuscular BID Antonieta Pert, MD      . hydrOXYzine (ATARAX/VISTARIL) tablet 25 mg  25 mg Oral TID PRN Money, Gerlene Burdock, FNP      . LORazepam (ATIVAN) tablet 2 mg  2 mg Oral Q6H PRN Money, Gerlene Burdock, FNP       Or  . LORazepam (ATIVAN) injection 2 mg  2 mg Intramuscular Q6H PRN Money, Gerlene Burdock, FNP      . risperiDONE (RISPERDAL M-TABS) disintegrating tablet 2 mg  2 mg Oral Q8H PRN Antonieta Pert, MD       And  . LORazepam (ATIVAN) tablet 1 mg  1 mg Oral PRN Antonieta Pert, MD       And  . ziprasidone (GEODON) injection 20 mg  20 mg Intramuscular PRN Antonieta Pert, MD      . magnesium hydroxide (MILK OF MAGNESIA) suspension 30 mL  30 mL Oral Daily PRN Money, Gerlene Burdock, FNP      . traZODone (DESYREL) tablet 50 mg  50 mg Oral QHS PRN Money, Gerlene Burdock, FNP       PTA  Medications: Medications Prior to Admission  Medication Sig Dispense Refill Last Dose  . ARIPiprazole (ABILIFY) 10 MG tablet Take 1 tablet (10 mg total) by mouth daily. For mood control (Patient not taking: Reported on 08/12/2020) 30 tablet 0 Not Taking at Unknown time  . ARIPiprazole ER (ABILIFY MAINTENA) 400 MG SRER injection Inject 2 mLs (400 mg total) into the muscle every 28 (twenty-eight) days. (Due 08-17-18): For mood control (Patient not taking: Reported on 08/12/2020) 1 each 0 Not Taking at Unknown time  . benztropine (COGENTIN) 0.5 MG tablet Take 1 tablet (0.5 mg total) by mouth daily. (Patient not taking: Reported on 08/12/2020) 30 tablet 0 Not Taking at Unknown time  . divalproex (DEPAKOTE) 500 MG DR tablet Take 1 tablet (500 mg total) by mouth 2 (two) times daily. (Patient not taking: Reported on 08/12/2020) 60 tablet 0 Not Taking at Unknown time  . hydrOXYzine (ATARAX/VISTARIL) 25 MG tablet Take 1 tablet (25 mg total) by mouth 2 (two) times daily. (Patient not taking: Reported on 08/12/2020) 30 tablet 0 Not Taking at Unknown time  .  traZODone (DESYREL) 50 MG tablet Take 1 tablet (50 mg total) by mouth at bedtime as needed for sleep. (Patient not taking: Reported on 08/12/2020) 30 tablet 0 Not Taking at Unknown time    Patient Stressors:    Patient Strengths:    Treatment Modalities: Medication Management, Group therapy, Case management,  1 to 1 session with clinician, Psychoeducation, Recreational therapy.   Physician Treatment Plan for Primary Diagnosis: <principal problem not specified> Long Term Goal(s): Improvement in symptoms so as ready for discharge Improvement in symptoms so as ready for discharge   Short Term Goals: Ability to identify changes in lifestyle to reduce recurrence of condition will improve Ability to verbalize feelings will improve Ability to disclose and discuss suicidal ideas Ability to demonstrate self-control will improve Ability to identify and develop  effective coping behaviors will improve Ability to maintain clinical measurements within normal limits will improve Compliance with prescribed medications will improve Ability to identify triggers associated with substance abuse/mental health issues will improve Ability to identify changes in lifestyle to reduce recurrence of condition will improve Ability to verbalize feelings will improve Ability to disclose and discuss suicidal ideas Ability to demonstrate self-control will improve Ability to identify and develop effective coping behaviors will improve Ability to maintain clinical measurements within normal limits will improve Compliance with prescribed medications will improve Ability to identify triggers associated with substance abuse/mental health issues will improve  Medication Management: Evaluate patient's response, side effects, and tolerance of medication regimen.  Therapeutic Interventions: 1 to 1 sessions, Unit Group sessions and Medication administration.  Evaluation of Outcomes: Not Progressing  Physician Treatment Plan for Secondary Diagnosis: Active Problems:   Schizophrenia (HCC)  Long Term Goal(s): Improvement in symptoms so as ready for discharge Improvement in symptoms so as ready for discharge   Short Term Goals: Ability to identify changes in lifestyle to reduce recurrence of condition will improve Ability to verbalize feelings will improve Ability to disclose and discuss suicidal ideas Ability to demonstrate self-control will improve Ability to identify and develop effective coping behaviors will improve Ability to maintain clinical measurements within normal limits will improve Compliance with prescribed medications will improve Ability to identify triggers associated with substance abuse/mental health issues will improve Ability to identify changes in lifestyle to reduce recurrence of condition will improve Ability to verbalize feelings will  improve Ability to disclose and discuss suicidal ideas Ability to demonstrate self-control will improve Ability to identify and develop effective coping behaviors will improve Ability to maintain clinical measurements within normal limits will improve Compliance with prescribed medications will improve Ability to identify triggers associated with substance abuse/mental health issues will improve     Medication Management: Evaluate patient's response, side effects, and tolerance of medication regimen.  Therapeutic Interventions: 1 to 1 sessions, Unit Group sessions and Medication administration.  Evaluation of Outcomes: Not Progressing   RN Treatment Plan for Primary Diagnosis: <principal problem not specified> Long Term Goal(s): Knowledge of disease and therapeutic regimen to maintain health will improve  Short Term Goals: Ability to remain free from injury will improve, Ability to verbalize feelings will improve, Ability to disclose and discuss suicidal ideas and Compliance with prescribed medications will improve  Medication Management: RN will administer medications as ordered by provider, will assess and evaluate patient's response and provide education to patient for prescribed medication. RN will report any adverse and/or side effects to prescribing provider.  Therapeutic Interventions: 1 on 1 counseling sessions, Psychoeducation, Medication administration, Evaluate responses to treatment, Monitor vital signs and  CBGs as ordered, Perform/monitor CIWA, COWS, AIMS and Fall Risk screenings as ordered, Perform wound care treatments as ordered.  Evaluation of Outcomes: Not Progressing   LCSW Treatment Plan for Primary Diagnosis: <principal problem not specified> Long Term Goal(s): Safe transition to appropriate next level of care at discharge, Engage patient in therapeutic group addressing interpersonal concerns.  Short Term Goals: Engage patient in aftercare planning with referrals  and resources, Increase ability to appropriately verbalize feelings, Increase emotional regulation and Increase skills for wellness and recovery  Therapeutic Interventions: Assess for all discharge needs, 1 to 1 time with Social worker, Explore available resources and support systems, Assess for adequacy in community support network, Educate family and significant other(s) on suicide prevention, Complete Psychosocial Assessment, Interpersonal group therapy.  Evaluation of Outcomes: Not Progressing   Progress in Treatment: Attending groups: No. Participating in groups: No. Taking medication as prescribed: Yes. Toleration medication: Yes. Family/Significant other contact made: No, will contact:  family upon obtaining consent. Patient understands diagnosis: No. Discussing patient identified problems/goals with staff: No. Medical problems stabilized or resolved: Yes. Denies suicidal/homicidal ideation: Yes. Issues/concerns per patient self-inventory: No. Other: N/A  New problem(s) identified: No, Describe:  None noted.  New Short Term/Long Term Goal(s):  Safe transition to appropriate next level of care at discharge, Engage patient in therapeutic group addressing interpersonal concerns.   Patient Goals:  Pt refused to attend.  Discharge Plan or Barriers: Pt to return to home. Pt to follow up with outpatient therapy and medication management services.  Reason for Continuation of Hospitalization: Aggression Delusions  Hallucinations Mania Medication stabilization  Estimated Length of Stay: 3-5 days  Attendees: Patient: Did not attend. 08/12/2020 12:22 PM  Physician: Dr. Jola Babinski, MD 08/12/2020 12:22 PM  Nursing:  08/12/2020 12:22 PM  RN Care Manager: 08/12/2020 12:22 PM  Social Worker: Cyril Loosen, LCSW 08/12/2020 12:22 PM  Recreational Therapist:  08/12/2020 12:22 PM  Other:  08/12/2020 12:22 PM  Other:  08/12/2020 12:22 PM  Other: 08/12/2020 12:22 PM    Scribe for Treatment  Team: Leisa Lenz, LCSW 08/12/2020 12:22 PM
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
## 2020-08-13 NOTE — [facility] Counselor (Signed)
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  CSW team to continue attempts.  Ambrose Mantle, LCSW 08/13/2020, 10:12 AM
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
## 2020-08-13 NOTE — Progress Notes (Signed)
   08/13/20 1000  Psych Admission Type (Psych Patients Only)  Admission Status Voluntary  Psychosocial Assessment  Patient Complaints Suspiciousness  Eye Contact Avoids;Avertive;Brief  Facial Expression Anxious;Pensive;Worried  Affect Anxious;Preoccupied  Speech Elective mutism  Interaction Avoidant;Cautious;Forwards little;Guarded;No initiation  Motor Activity Fidgety  Appearance/Hygiene In scrubs  Behavior Characteristics Unwilling to participate  Mood Suspicious  Aggressive Behavior  Targets Property  Type of Behavior Fire setting  Effect No apparent injury  Thought Process  Coherency Blocking  Content Preoccupation;Paranoia  Delusions Paranoid  Perception WDL  Hallucination UTA  Judgment Poor  Confusion Moderate  Danger to Self  Current suicidal ideation? Denies  Danger to Others  Danger to Others None reported or observed  Danger to Others Abnormal  Destructive Behavior Acts of violence toward property observed   Dar Note: Patient presents with a flat affect and depressed mood. Denies suicidal thoughts, auditory and visual hallucination.  Oral medication given with encouragement and support.  Remained in his room for majority of this shift.  Routine safety checks maintained every 15 minutes.  Patient is safe on the unit.

## 2020-08-13 NOTE — Progress Notes (Signed)
Psychoeducational Group Note  Date:  08/13/2020 Time:  2030  Group Topic/Focus:  Wrap-Up Group:   The focus of this group is to help patients review their daily goal of treatment and discuss progress on daily workbooks.  Participation Level: Did Not Attend  Participation Quality:  Not Applicable  Affect:  Not Applicable  Cognitive:  Not Applicable  Insight:  Not Applicable  Engagement in Group: Not Applicable  Additional Comments:  The patient did not attend group this evening.   Berneita Sanagustin S 08/13/2020, 8:30 PM

## 2020-08-14 MED ORDER — HALOPERIDOL LACTATE 5 MG/ML IJ SOLN
15.0000 mg | Freq: Two times a day (BID) | INTRAMUSCULAR | Status: DC
  Administered 2020-08-14: 15 mg via INTRAMUSCULAR
  Filled 2020-08-14 (×8): qty 3

## 2020-08-14 MED ORDER — HALOPERIDOL 5 MG PO TABS
15.0000 mg | ORAL_TABLET | Freq: Two times a day (BID) | ORAL | Status: DC
  Administered 2020-08-15 – 2020-08-16 (×4): 15 mg via ORAL
  Filled 2020-08-14 (×8): qty 3

## 2020-08-14 NOTE — [facility] Counselor (Signed)
[facility] LCSW Note  08/14/2020   11:19 AM  Type of Contact and Topic:  PSA attempt  CSW attempted to complete PSA with patient. CSW prompted pt to participate. Patient refused remaining selectively mute. CSW team will make additional efforts at later time.    Leisa Lenz, LCSW 08/14/2020  11:19 AM

## 2020-08-14 NOTE — Progress Notes (Signed)
   08/14/20 1100  Psych Admission Type (Psych Patients Only)  Admission Status Involuntary  Psychosocial Assessment  Patient Complaints Suspiciousness  Eye Contact Avoids;Avertive;Brief  Facial Expression Anxious;Pensive;Worried  Affect Anxious;Preoccupied  Speech Elective mutism  Interaction Avoidant;Cautious;Forwards little;Guarded;No initiation  Motor Activity Fidgety  Appearance/Hygiene In scrubs  Behavior Characteristics Unwilling to participate  Mood Suspicious  Aggressive Behavior  Targets Property  Type of Behavior Fire setting  Effect No apparent injury  Thought Process  Coherency Blocking  Content Preoccupation;Paranoia  Delusions Paranoid  Perception Hallucinations  Hallucination Auditory  Judgment Poor  Confusion Moderate  Danger to Self  Current suicidal ideation? Denies  Danger to Others  Danger to Others None reported or observed  Danger to Others Abnormal  Destructive Behavior Acts of violence toward property observed
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
## 2020-08-15 MED ORDER — BENZTROPINE MESYLATE 1 MG PO TABS
1.0000 mg | ORAL_TABLET | Freq: Two times a day (BID) | ORAL | Status: DC
  Administered 2020-08-15 – 2020-08-16 (×4): 1 mg via ORAL
  Filled 2020-08-15 (×8): qty 1

## 2020-08-15 NOTE — Progress Notes (Signed)
Pt has been isolative in his room. Pt's reluctant to take his evening medications, pt also has been non verbal and only respond by nodding his head. Pt companied of muscle spasms on his legs, Benadryl 50 mg PO given, pt was able to go bed and stayed in bed for the rest of the shift, will continue to monitor.
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
## 2020-08-15 NOTE — Progress Notes (Signed)
Recreation Therapy Notes  Date: 9.2.21 Time: 0945 Location: [address] Dayroom    Group Topic: Communication, Team Building, Problem Solving  Goal Area(s) Addresses:  Patient will effectively work with peer towards shared goal.  Patient will identify skills used to make activity successful.  Patient will identify how skills used during activity can be used to reach post d/c goals.   Intervention: STEM Activity  Activity: Landing Pad. In teams patients were given 12 plastic drinking straws and a length of masking tape. Using the materials provided patients were asked to build a landing pad to catch a golf ball dropped from approximately 6 feet in the air.   Education: Social Skills, Discharge Planning   Education Outcome: Acknowledges education/In group clarification offered/Needs additional education.   Clinical Observations/Feedback: Pt did not attend group session.    Brielle Moro, LRT/CTRS         Linna Thebeau A 08/15/2020 11:03 AM 

## 2020-08-16 MED ORDER — DIVALPROEX SODIUM ER 500 MG PO TB24
1000.0000 mg | ORAL_TABLET | Freq: Every day | ORAL | Status: DC
  Administered 2020-08-16 – 2020-08-21 (×6): 1000 mg via ORAL
  Filled 2020-08-16 (×10): qty 2

## 2020-08-16 MED ORDER — BENZTROPINE MESYLATE 1 MG PO TABS: 2.0000 mg | ORAL_TABLET | Freq: Two times a day (BID) | ORAL | Status: DC | PRN

## 2020-08-16 MED ORDER — HALOPERIDOL 5 MG PO TABS: 10.0000 mg | ORAL_TABLET | Freq: Four times a day (QID) | ORAL | Status: DC | PRN

## 2020-08-16 MED ORDER — HALOPERIDOL LACTATE 5 MG/ML IJ SOLN: 10.0000 mg | Freq: Four times a day (QID) | INTRAMUSCULAR | Status: DC | PRN

## 2020-08-16 NOTE — Progress Notes (Signed)
Pt continue to be isolative, not responding when spoken to. Refused to come out for meds and snacks, meds offered in the room and pt was able to take. No issues this far, will continue to monitor.

## 2020-08-16 NOTE — Progress Notes (Signed)
SPIRITUALITY GROUP NOTE  °Pt attended spirituality group facilitated by Chaplain Shalina Norfolk, MDIv, BCC.  °Group Description:  Group focused on topic of hope.  Patients participated in facilitated discussion around topic, connecting with one another around experiences and definitions for hope.  Group members engaged with visual explorer photos, reflecting on what hope looks like for them today.  Group engaged in discussion around how their definitions of hope are present today in hospital.   °Modalities: Psycho-social ed, Adlerian, Narrative, MI °Patient Progress: °Did not attend  °

## 2020-08-16 NOTE — Tx Team (Signed)
Interdisciplinary Treatment and Diagnostic Plan Update  08/16/2020 Time of Session: 10:20am David [name] MRN: [id number]  Principal Diagnosis: <principal problem not specified>  Secondary Diagnoses: Active Problems:   Schizophrenia (HCC)   Current Medications:  Current Facility-Administered Medications  Medication Dose Route Frequency Provider Last Rate Last Admin   acetaminophen (TYLENOL) tablet 650 mg  650 mg Oral Q6H PRN Money, Gerlene Burdock, FNP       alum & mag hydroxide-simeth (MAALOX/MYLANTA) 200-200-20 MG/5ML suspension 30 mL  30 mL Oral Q4H PRN Money, Gerlene Burdock, FNP       ARIPiprazole ER (ABILIFY MAINTENA) injection 400 mg  400 mg Intramuscular Q28 days Antonieta Pert, MD   400 mg at 08/12/20 1735   benztropine (COGENTIN) tablet 1 mg  1 mg Oral BID Antonieta Pert, MD   1 mg at 08/16/20 0955   diphenhydrAMINE (BENADRYL) capsule 50 mg  50 mg Oral Q6H PRN Money, Gerlene Burdock, FNP   50 mg at 08/15/20 0100   Or   diphenhydrAMINE (BENADRYL) injection 50 mg  50 mg Intramuscular Q6H PRN Money, Gerlene Burdock, FNP       divalproex (DEPAKOTE) DR tablet 500 mg  500 mg Oral Q12H Money, Gerlene Burdock, FNP   500 mg at 08/16/20 4431   haloperidol (HALDOL) tablet 15 mg  15 mg Oral BID Antonieta Pert, MD   15 mg at 08/16/20 5400   Or   haloperidol lactate (HALDOL) injection 15 mg  15 mg Intramuscular BID Antonieta Pert, MD   15 mg at 08/14/20 1733   hydrOXYzine (ATARAX/VISTARIL) tablet 25 mg  25 mg Oral TID PRN Money, Gerlene Burdock, FNP   25 mg at 08/15/20 2053   LORazepam (ATIVAN) tablet 2 mg  2 mg Oral Q6H PRN Money, Gerlene Burdock, FNP       Or   LORazepam (ATIVAN) injection 2 mg  2 mg Intramuscular Q6H PRN Money, Gerlene Burdock, FNP       risperiDONE (RISPERDAL M-TABS) disintegrating tablet 2 mg  2 mg Oral Q8H PRN Antonieta Pert, MD       And   LORazepam (ATIVAN) tablet 1 mg  1 mg Oral PRN Antonieta Pert, MD       And   ziprasidone (GEODON) injection 20 mg  20 mg Intramuscular PRN  Antonieta Pert, MD       magnesium hydroxide (MILK OF MAGNESIA) suspension 30 mL  30 mL Oral Daily PRN Money, Gerlene Burdock, FNP       traZODone (DESYREL) tablet 50 mg  50 mg Oral QHS PRN Money, Gerlene Burdock, FNP   50 mg at 08/15/20 2053   PTA Medications: Medications Prior to Admission  Medication Sig Dispense Refill Last Dose   ARIPiprazole (ABILIFY) 10 MG tablet Take 1 tablet (10 mg total) by mouth daily. For mood control (Patient not taking: Reported on 08/12/2020) 30 tablet 0 Not Taking at Unknown time   ARIPiprazole ER (ABILIFY MAINTENA) 400 MG SRER injection Inject 2 mLs (400 mg total) into the muscle every 28 (twenty-eight) days. (Due 08-17-18): For mood control (Patient not taking: Reported on 08/12/2020) 1 each 0 Not Taking at Unknown time   benztropine (COGENTIN) 0.5 MG tablet Take 1 tablet (0.5 mg total) by mouth daily. (Patient not taking: Reported on 08/12/2020) 30 tablet 0 Not Taking at Unknown time   divalproex (DEPAKOTE) 500 MG DR tablet Take 1 tablet (500 mg total) by mouth 2 (two) times daily. (Patient not taking: Reported  on 08/12/2020) 60 tablet 0 Not Taking at Unknown time   hydrOXYzine (ATARAX/VISTARIL) 25 MG tablet Take 1 tablet (25 mg total) by mouth 2 (two) times daily. (Patient not taking: Reported on 08/12/2020) 30 tablet 0 Not Taking at Unknown time   traZODone (DESYREL) 50 MG tablet Take 1 tablet (50 mg total) by mouth at bedtime as needed for sleep. (Patient not taking: Reported on 08/12/2020) 30 tablet 0 Not Taking at Unknown time    Patient Stressors:    Patient Strengths:    Treatment Modalities: Medication Management, Group therapy, Case management,  1 to 1 session with clinician, Psychoeducation, Recreational therapy.   Physician Treatment Plan for Primary Diagnosis: <principal problem not specified> Long Term Goal(s): Improvement in symptoms so as ready for discharge Improvement in symptoms so as ready for discharge   Short Term Goals: Ability to identify  changes in lifestyle to reduce recurrence of condition will improve Ability to verbalize feelings will improve Ability to disclose and discuss suicidal ideas Ability to demonstrate self-control will improve Ability to identify and develop effective coping behaviors will improve Ability to maintain clinical measurements within normal limits will improve Compliance with prescribed medications will improve Ability to identify triggers associated with substance abuse/mental health issues will improve Ability to identify changes in lifestyle to reduce recurrence of condition will improve Ability to verbalize feelings will improve Ability to disclose and discuss suicidal ideas Ability to demonstrate self-control will improve Ability to identify and develop effective coping behaviors will improve Ability to maintain clinical measurements within normal limits will improve Compliance with prescribed medications will improve Ability to identify triggers associated with substance abuse/mental health issues will improve  Medication Management: Evaluate patient's response, side effects, and tolerance of medication regimen.  Therapeutic Interventions: 1 to 1 sessions, Unit Group sessions and Medication administration.  Evaluation of Outcomes: Not Progressing  Physician Treatment Plan for Secondary Diagnosis: Active Problems:   Schizophrenia (HCC)  Long Term Goal(s): Improvement in symptoms so as ready for discharge Improvement in symptoms so as ready for discharge   Short Term Goals: Ability to identify changes in lifestyle to reduce recurrence of condition will improve Ability to verbalize feelings will improve Ability to disclose and discuss suicidal ideas Ability to demonstrate self-control will improve Ability to identify and develop effective coping behaviors will improve Ability to maintain clinical measurements within normal limits will improve Compliance with prescribed medications will  improve Ability to identify triggers associated with substance abuse/mental health issues will improve Ability to identify changes in lifestyle to reduce recurrence of condition will improve Ability to verbalize feelings will improve Ability to disclose and discuss suicidal ideas Ability to demonstrate self-control will improve Ability to identify and develop effective coping behaviors will improve Ability to maintain clinical measurements within normal limits will improve Compliance with prescribed medications will improve Ability to identify triggers associated with substance abuse/mental health issues will improve     Medication Management: Evaluate patient's response, side effects, and tolerance of medication regimen.  Therapeutic Interventions: 1 to 1 sessions, Unit Group sessions and Medication administration.  Evaluation of Outcomes: Not Progressing   RN Treatment Plan for Primary Diagnosis: <principal problem not specified> Long Term Goal(s): Knowledge of disease and therapeutic regimen to maintain health will improve  Short Term Goals: Ability to remain free from injury will improve, Ability to verbalize feelings will improve, Ability to disclose and discuss suicidal ideas and Compliance with prescribed medications will improve  Medication Management: RN will administer medications as ordered by  provider, will assess and evaluate patient's response and provide education to patient for prescribed medication. RN will report any adverse and/or side effects to prescribing provider.  Therapeutic Interventions: 1 on 1 counseling sessions, Psychoeducation, Medication administration, Evaluate responses to treatment, Monitor vital signs and CBGs as ordered, Perform/monitor CIWA, COWS, AIMS and Fall Risk screenings as ordered, Perform wound care treatments as ordered.  Evaluation of Outcomes: Not Progressing   LCSW Treatment Plan for Primary Diagnosis: <principal problem not  specified> Long Term Goal(s): Safe transition to appropriate next level of care at discharge, Engage patient in therapeutic group addressing interpersonal concerns.  Short Term Goals: Engage patient in aftercare planning with referrals and resources, Increase ability to appropriately verbalize feelings, Increase emotional regulation and Increase skills for wellness and recovery  Therapeutic Interventions: Assess for all discharge needs, 1 to 1 time with Social worker, Explore available resources and support systems, Assess for adequacy in community support network, Educate family and significant other(s) on suicide prevention, Complete Psychosocial Assessment, Interpersonal group therapy.  Evaluation of Outcomes: Not Progressing   Progress in Treatment: Attending groups: No. Participating in groups: No. Taking medication as prescribed: Yes. Toleration medication: Yes. Family/Significant other contact made: No, will contact:  family upon obtaining consent. Patient understands diagnosis: No. Discussing patient identified problems/goals with staff: No. Medical problems stabilized or resolved: Yes. Denies suicidal/homicidal ideation: Yes. Issues/concerns per patient self-inventory: No. Other: N/A  New problem(s) identified: No, Describe:  None noted.  New Short Term/Long Term Goal(s):  Safe transition to appropriate next level of care at discharge, Engage patient in therapeutic group addressing interpersonal concerns.   Patient Goals:  Pt refused to attend.  Discharge Plan or Barriers: Pt to return to home. Pt to follow up with outpatient therapy and medication management services.  Reason for Continuation of Hospitalization: Aggression Delusions  Hallucinations Mania Medication stabilization  Estimated Length of Stay: 3-5 days  Attendees: Patient:  08/16/2020  Physician:  08/16/2020  Nursing:  08/16/2020   RN Care Manager: 08/16/2020  Social Worker: Ruthann Cancer, LCSW 08/16/2020    Recreational Therapist:  08/16/2020   Other:  08/16/2020   Other:  08/16/2020   Other: 08/16/2020    Scribe for Treatment Team: Otelia Santee, LCSW 08/16/2020 1:00 PM

## 2020-08-16 NOTE — Progress Notes (Signed)
   08/16/20 2247  Psych Admission Type (Psych Patients Only)  Admission Status Involuntary  Psychosocial Assessment  Patient Complaints Depression;Isolation  Eye Contact Avoids;Avertive;Brief  Facial Expression Anxious;Pensive;Worried  Affect Anxious;Preoccupied  Speech Elective mutism  Interaction Avoidant;Cautious;Forwards little;Guarded;No initiation  Motor Activity Fidgety  Appearance/Hygiene In scrubs  Behavior Characteristics Unwilling to participate;Guarded  Mood Depressed;Suspicious  Aggressive Behavior  Targets Property  Type of Behavior Fire setting  Effect No apparent injury  Thought Radio producer  Content Preoccupation;Paranoia  Delusions Paranoid  Perception Hallucinations  Hallucination Auditory  Judgment Poor  Confusion Moderate  Danger to Self  Current suicidal ideation? Denies  Danger to Others  Danger to Others None reported or observed  Danger to Others Abnormal  Destructive Behavior Acts of violence toward property observed   D: Patient isolative his room all evening. Pt is guarded and suspicious but was able to take medication.  A: Medications administered as prescribed. Support and encouragement provided as needed.  R: Patient remains safe on the unit. Will continue to monitor for safety and stability.

## 2020-08-16 NOTE — Progress Notes (Signed)
   08/16/20 1600  Psych Admission Type (Psych Patients Only)  Admission Status Involuntary  Psychosocial Assessment  Patient Complaints Depression;Isolation  Eye Contact Avoids;Avertive;Brief  Facial Expression Anxious;Pensive;Worried  Affect Anxious;Preoccupied  Speech Elective mutism  Interaction Avoidant;Cautious;Forwards little;Guarded;No initiation  Motor Activity Fidgety  Appearance/Hygiene In scrubs  Behavior Characteristics Appropriate to situation  Mood Depressed  Aggressive Behavior  Targets Property  Type of Behavior Fire setting  Effect No apparent injury  Thought Process  Coherency Blocking  Content Preoccupation;Paranoia  Delusions Paranoid  Perception Hallucinations  Hallucination Auditory  Judgment Poor  Confusion Moderate  Danger to Self  Current suicidal ideation? Denies  Danger to Others  Danger to Others None reported or observed  Danger to Others Abnormal  Destructive Behavior Acts of violence toward property observed
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
## 2020-08-17 MED ORDER — DIPHENHYDRAMINE HCL 50 MG/ML IJ SOLN: 50.0000 mg | Freq: Four times a day (QID) | INTRAMUSCULAR | Status: DC | PRN

## 2020-08-17 MED ORDER — ARIPIPRAZOLE 10 MG PO TABS
10.0000 mg | ORAL_TABLET | Freq: Every day | ORAL | Status: DC
  Administered 2020-08-17 – 2020-08-20 (×4): 10 mg via ORAL
  Filled 2020-08-17 (×7): qty 1

## 2020-08-17 MED ORDER — DIPHENHYDRAMINE HCL 25 MG PO CAPS
50.0000 mg | ORAL_CAPSULE | Freq: Four times a day (QID) | ORAL | Status: DC | PRN
  Administered 2020-08-19 – 2020-08-22 (×4): 50 mg via ORAL
  Filled 2020-08-17 (×4): qty 2

## 2020-08-17 NOTE — [facility] Group Notes (Signed)
  [facility] LCSW Group Therapy Note  Date/Time:  08/17/2020 2:15-3:00pm  Type of Therapy and Topic:  Group Therapy:  Feelings About Hospitalization  Participation Level:  Did Not Attend   Description of Group This process group involved patients discussing their feelings related to being hospitalized, as well as the benefits they see to being in the hospital.  These feelings and benefits were itemized.  The group then brainstormed specific ways in which they could seek those same benefits when they discharge and return home.  Therapeutic Goals 1. Patient will identify and describe positive and negative feelings related to hospitalization 2. Patient will verbalize benefits of hospitalization to themselves personally 3. Patients will brainstorm together ways they can obtain similar benefits in the outpatient setting, identify barriers to wellness and possible solutions  Summary of Patient Progress:  The patient was invited to group but did not attend.  Therapeutic Modalities Cognitive Behavioral Therapy Motivational Interviewing    Ambrose Mantle, LCSW 08/17/2020, 4:28 PM
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
## 2020-08-17 NOTE — Progress Notes (Signed)
   08/17/20 1400  Psych Admission Type (Psych Patients Only)  Admission Status Involuntary  Psychosocial Assessment  Patient Complaints Depression  Eye Contact Avoids;Avertive;Brief  Facial Expression Anxious;Sad  Affect Anxious;Preoccupied  Speech Elective mutism  Interaction Avoidant;Cautious;Forwards little;Guarded;No initiation  Motor Activity Fidgety  Appearance/Hygiene In scrubs  Behavior Characteristics Unwilling to participate  Mood Depressed;Sullen  Aggressive Behavior  Targets Property  Type of Behavior Fire setting  Effect No apparent injury  Thought Radio producer  Content Preoccupation;Paranoia  Delusions Paranoid  Perception Hallucinations  Hallucination None reported or observed  Judgment Poor  Confusion Moderate  Danger to Self  Current suicidal ideation? Denies  Danger to Others  Danger to Others None reported or observed  Danger to Others Abnormal  Destructive Behavior Acts of violence toward property observed

## 2020-08-17 NOTE — Progress Notes (Signed)
[place] NOVEL CORONAVIRUS (COVID-19) DAILY CHECK-OFF SYMPTOMS - answer yes or no to each - every day NO YES  Have you had a fever in the past 24 hours?  . Fever (Temp > 37.80C / 100F) X   Have you had any of these symptoms in the past 24 hours? . New Cough .  Sore Throat  .  Shortness of Breath .  Difficulty Breathing .  Unexplained Body Aches   X   Have you had any one of these symptoms in the past 24 hours not related to allergies?   . Runny Nose .  Nasal Congestion .  Sneezing   X   If you have had runny nose, nasal congestion, sneezing in the past 24 hours, has it worsened?  X   EXPOSURES - check yes or no X   Have you traveled outside the state in the past 14 days?  X   Have you been in contact with someone with a confirmed diagnosis of COVID-19 or PUI in the past 14 days without wearing appropriate PPE?  X   Have you been living in the same home as a person with confirmed diagnosis of COVID-19 or a PUI (household contact)?    X   Have you been diagnosed with COVID-19?    X              What to do next: Answered NO to all: Answered YES to anything:   Proceed with unit schedule Follow the [facility] Inpatient Flowsheet.   

## 2020-08-17 NOTE — Progress Notes (Signed)
   08/17/20 2305  Psych Admission Type (Psych Patients Only)  Admission Status Involuntary  Psychosocial Assessment  Patient Complaints Depression;Isolation  Eye Contact Avoids;Avertive;Brief  Facial Expression Anxious;Sad  Affect Anxious;Preoccupied  Speech Other (Comment) (paranoia, minimal)  Interaction Avoidant;Cautious;Forwards little;Guarded;No initiation  Motor Activity Fidgety  Appearance/Hygiene In scrubs  Behavior Characteristics Unwilling to participate;Guarded  Mood Depressed;Suspicious  Aggressive Behavior  Targets Property  Type of Behavior Fire setting  Effect No apparent injury  Thought Radio producer  Content Preoccupation;Paranoia  Delusions Paranoid  Perception Hallucinations  Hallucination None reported or observed  Judgment Poor  Confusion Moderate  Danger to Self  Current suicidal ideation? Denies  Danger to Others  Danger to Others None reported or observed  Danger to Others Abnormal  Destructive Behavior Acts of violence toward property observed   D: Patient continue to isolate to his room. Pt is paranoid, guarded and unwilling to participate in milieu even with reenforcement.   A: Medications administered as prescribed. Support and encouragement provided as needed.  R: Patient remains safe on the unit. Will continue to monitor for safety and stability.

## 2020-08-17 NOTE — [facility] Group Notes (Signed)
Adult Psychoeducational Group Note  Date:  08/17/2020 Time:  11:12 AM  Group Topic/Focus:  Goals Group:   The focus of this group is to help patients establish daily goals to achieve during treatment and discuss how the patient can incorporate goal setting into their daily lives to aide in recovery.  Participation Level:  Did Not Attend    David [name] 08/17/2020, 11:12 AM  

## 2020-08-18 MED ORDER — LORAZEPAM 1 MG PO TABS
2.0000 mg | ORAL_TABLET | Freq: Once | ORAL | Status: AC
  Administered 2020-08-18: 2 mg via ORAL
  Filled 2020-08-18: qty 2

## 2020-08-18 MED ORDER — LORAZEPAM 2 MG/ML IJ SOLN: 2.0000 mg | Freq: Once | INTRAMUSCULAR | Status: AC

## 2020-08-18 NOTE — Progress Notes (Signed)
   08/18/20 1100  Psych Admission Type (Psych Patients Only)  Admission Status Involuntary  Psychosocial Assessment  Patient Complaints Depression;Isolation  Eye Contact Avoids;Avertive;Brief  Facial Expression Anxious;Sad  Affect Anxious;Preoccupied  Speech Other (Comment) (paranoia, minimal)  Interaction Avoidant;Cautious;Forwards little;Guarded;No initiation  Motor Activity Fidgety  Appearance/Hygiene In scrubs  Behavior Characteristics Unwilling to participate  Mood Depressed;Sullen  Aggressive Behavior  Targets Property  Type of Behavior Fire setting  Effect No apparent injury  Thought Radio producer  Content Preoccupation;Paranoia  Delusions Paranoid  Perception Hallucinations  Hallucination None reported or observed  Judgment Poor  Confusion Moderate  Danger to Self  Current suicidal ideation? Denies  Danger to Others  Danger to Others None reported or observed  Danger to Others Abnormal  Destructive Behavior Acts of violence toward property observed

## 2020-08-18 NOTE — Progress Notes (Signed)
Psychoeducational Group Note  Date:  08/18/2020 Time:  2029  Group Topic/Focus:  Wrap-Up Group:   The focus of this group is to help patients review their daily goal of treatment and discuss progress on daily workbooks.  Participation Level: Did Not Attend  Participation Quality:  Not Applicable  Affect:  Not Applicable  Cognitive:  Not Applicable  Insight:  Not Applicable  Engagement in Group: Not Applicable  Additional Comments:  The patient did not attend group this evening.   Hazle Coca S 08/18/2020, 8:29 PM

## 2020-08-18 NOTE — Progress Notes (Signed)
   08/18/20 2104  Psych Admission Type (Psych Patients Only)  Admission Status Involuntary  Psychosocial Assessment  Patient Complaints None  Eye Contact Brief  Facial Expression Flat  Affect Apprehensive;Preoccupied  Speech Unremarkable  Interaction Minimal  Appearance/Hygiene In hospital gown (clothes being washed)  Behavior Characteristics Cooperative;Appropriate to situation  Mood Preoccupied  Thought Process  Coherency WDL  Content WDL  Delusions WDL  Perception WDL  Hallucination None reported or observed  Judgment WDL  Confusion WDL  Danger to Self  Current suicidal ideation? Denies  Danger to Others  Danger to Others None reported or observed  Patient came out of room up to dayroom for snack tonight. Answering questions when asking but denies any issues with mood tonight. Denies SI/HI and A/V hallucinations. Took Depakote without issue. Asking about his brother calling earlier and MHT said at that time he didn't want to speak to brother but MHT got number for patient to call if he wanted to call him back. Will continue to monitor.

## 2020-08-18 NOTE — Progress Notes (Signed)
Adult Psychoeducational Group Note  Date:  08/18/2020 Time:  3:23 AM  Group Topic/Focus:  Wrap-Up Group:   The focus of this group is to help patients review their daily goal of treatment and discuss progress on daily workbooks.  Participation Level:  Did Not Attend  Participation Quality:  Did Not Attend  Affect:  Did Not Attend  Cognitive:  Did Not Attend  Insight: None  Engagement in Group:  Did Not Attend  Modes of Intervention:  Did Not Attend  Additional Comments:  Pt did not attend evening wrap up group tonight.  [name] 08/18/2020, 3:23 AM

## 2020-08-18 NOTE — [facility] Group Notes (Signed)
[facility] LCSW Group Therapy Note  Date/Time:  08/18/2020  11:00AM-12:00PM  Type of Therapy and Topic:  Group Therapy:  Music and Mood  Participation Level:  Did Not Attend   Description of Group: In this process group, members listened to a variety of genres of music and identified that different types of music evoke different responses.  Patients were encouraged to identify music that was soothing for them and music that was energizing for them.  Patients discussed how this knowledge can help with wellness and recovery in various ways including managing depression and anxiety as well as encouraging healthy sleep habits.    Therapeutic Goals: Patients will explore the impact of different varieties of music on mood Patients will verbalize the thoughts they have when listening to different types of music Patients will identify music that is soothing to them as well as music that is energizing to them Patients will discuss how to use this knowledge to assist in maintaining wellness and recovery Patients will explore the use of music as a coping skill  Summary of Patient Progress:  N/A   Therapeutic Modalities: Solution Focused Brief Therapy Activity   Kyen Taite Grossman-Orr, LCSW    
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
## 2020-08-18 NOTE — Plan of Care (Addendum)
°  Problem: Education: Goal: Emotional status will improve Outcome: Progressing   Problem: Activity: Goal: Interest or engagement in activities will improve Outcome: Progressing     Patient up out of bed with encouragement from staff- observed sitting in the dayroom watching tv.

## 2020-08-18 NOTE — [facility] Group Notes (Signed)
Adult Psychoeducational Group Not Date:  08/18/2020 Time:  1000-1045 Group Topic/Focus: PROGRESSIVE RELAXATION. A group where deep breathing is taught and tensing and relaxation muscle groups is used. Imagery is used as well.  Pts are asked to imagine 3 pillars that hold them up when they are not able to hold themselves up.  Participation Level:  Did not attend   Dione Housekeeper 08/18/2020

## 2020-08-18 NOTE — Progress Notes (Signed)
[place] NOVEL CORONAVIRUS (COVID-19) DAILY CHECK-OFF SYMPTOMS - answer yes or no to each - every day NO YES  Have you had a fever in the past 24 hours?  . Fever (Temp > 37.80C / 100F) X   Have you had any of these symptoms in the past 24 hours? . New Cough .  Sore Throat  .  Shortness of Breath .  Difficulty Breathing .  Unexplained Body Aches   X   Have you had any one of these symptoms in the past 24 hours not related to allergies?   . Runny Nose .  Nasal Congestion .  Sneezing   X   If you have had runny nose, nasal congestion, sneezing in the past 24 hours, has it worsened?  X   EXPOSURES - check yes or no X   Have you traveled outside the state in the past 14 days?  X   Have you been in contact with someone with a confirmed diagnosis of COVID-19 or PUI in the past 14 days without wearing appropriate PPE?  X   Have you been living in the same home as a person with confirmed diagnosis of COVID-19 or a PUI (household contact)?    X   Have you been diagnosed with COVID-19?    X              What to do next: Answered NO to all: Answered YES to anything:   Proceed with unit schedule Follow the [facility] Inpatient Flowsheet.   

## 2020-08-19 MED ORDER — LORAZEPAM 1 MG PO TABS
1.0000 mg | ORAL_TABLET | Freq: Three times a day (TID) | ORAL | Status: DC
  Administered 2020-08-19 – 2020-08-20 (×3): 1 mg via ORAL
  Filled 2020-08-19 (×4): qty 1
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
## 2020-08-19 NOTE — [facility] Counselor (Signed)
CSW completed verbal screening and faxed referral to [facility] for possible placement. Authorization #[id number] and is valid from 08/19/20 to 08/25/20.    Ruthann Cancer MSW, LCSW Clincal Social Worker  [facility]

## 2020-08-19 NOTE — Progress Notes (Signed)
Recreation Therapy Notes  Date: 9.6.21 Time: 1015 Location: 500 Hall Dayroom  Group Topic: Stress Management  Goal Area(s) Addresses:  Patient will identify positive stress management techniques. Patient will identify benefits of using stress management post d/c.  Intervention: Stress Meditation  Activity: Meditation.  LRT played a meditation that focused on fostering positive morning energy that helps develop good energy for the day.  Patients were to listen and follow along as meditation played to engage in the activity.    Education:  Stress Management, Discharge Planning.   Education Outcome: Acknowledges Education  Clinical Observations/Feedback: Pt did not attend group session.    [name], LRT/CTRS         [name] A 08/19/2020 11:29 AM

## 2020-08-19 NOTE — Progress Notes (Signed)
Pt visible in the dayroom acting appropriately, pt conversation coherent, pt not appearing to present with thought blocking. Pt observed asking appropriate questions with peers and giving appropriate responses.    08/19/20 2000  Psych Admission Type (Psych Patients Only)  Admission Status Involuntary  Psychosocial Assessment  Eye Contact Avoids  Facial Expression Flat  Affect Depressed  Speech Unremarkable  Interaction Minimal  Motor Activity Slow  Appearance/Hygiene In scrubs (clothes being washed)  Behavior Characteristics Cooperative;Appropriate to situation  Mood Depressed  Thought Process  Coherency Blocking  Content UTA  Delusions None reported or observed  Perception WDL  Hallucination None reported or observed  Judgment Poor  Danger to Self  Current suicidal ideation? Denies  Danger to Others  Danger to Others None reported or observed  Danger to Others Abnormal  Harmful Behavior to others No threats or harm toward other people  Destructive Behavior No threats or harm toward property

## 2020-08-19 NOTE — Progress Notes (Signed)
   08/19/20 1052  Vital Signs  Temp 97.8 F (36.6 C)  Temp Source Oral  Pulse Rate (!) 53 (rn notified)  Pulse Rate Source Dinamap  Resp 16  BP 105/67  BP Location Right Arm  BP Method Automatic  Patient Position (if appropriate) Lying  Oxygen Therapy  SpO2 100 %   D:  Patient denies SI/HI/AVH. Patient was isolative in his room in the morning, but was out in open areas in the afternoon and was social with peers and staff. A:  Patient took scheduled medicine.  Support and encouragement provided Routine safety checks conducted every 15 minutes. Patient  Informed to notify staff with any concerns.   R:Safety maintained.
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
## 2020-08-20 MED ORDER — LORAZEPAM 0.5 MG PO TABS
0.5000 mg | ORAL_TABLET | Freq: Three times a day (TID) | ORAL | Status: DC
  Administered 2020-08-20 – 2020-08-21 (×4): 0.5 mg via ORAL
  Filled 2020-08-20 (×4): qty 1

## 2020-08-20 NOTE — [facility] Suicide Risk Assessment (Signed)
[facility] INPATIENT:  Family/Significant Other Suicide Prevention Education  Suicide Prevention Education: Education Completed; Mother Anthonette Legato [phone]) has been identified by the patient as the family member/significant other with whom the patient will be residing, and identified as the person(s) who will aid the patient in the event of a mental health crisis (suicidal ideations/suicide attempt).  With written consent from the patient, the family member/significant other has been provided the following suicide prevention education, prior to the and/or following the discharge of the patient.  The suicide prevention education provided includes the following:  Suicide risk factors  Suicide prevention and interventions  National Suicide Hotline telephone number  [facility] assessment telephone number  [facility] Emergency Assistance 911  [facility] Facility and/or Residential Mobile Crisis Unit telephone number     The family member/significant other verbalizes understanding of the suicide prevention education information provided.  The family member/significant other agrees to remove the items of safety concern listed above.    Ruthann Cancer MSW, LCSW Clincal Social Worker  [facility]

## 2020-08-20 NOTE — Progress Notes (Signed)
Pt stated he felt more comfortable talking to the male doctor

## 2020-08-20 NOTE — Progress Notes (Signed)
Pt anxious and paranoid on the unit this evening    08/20/20 2200  Psych Admission Type (Psych Patients Only)  Admission Status Involuntary  Psychosocial Assessment  Patient Complaints None  Eye Contact Avoids  Facial Expression Flat  Affect Depressed  Speech Unremarkable  Interaction Minimal  Motor Activity Slow  Appearance/Hygiene In scrubs (clothes being washed)  Behavior Characteristics Cooperative  Mood Suspicious;Anxious  Thought Process  Coherency Blocking  Content UTA  Delusions None reported or observed  Perception WDL  Hallucination None reported or observed  Judgment Poor  Confusion None  Danger to Self  Current suicidal ideation? Denies  Danger to Others  Danger to Others None reported or observed  Danger to Others Abnormal  Harmful Behavior to others No threats or harm toward other people  Destructive Behavior No threats or harm toward property
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
## 2020-08-21 MED ORDER — LORAZEPAM 0.5 MG PO TABS: 0.5000 mg | ORAL_TABLET | Freq: Three times a day (TID) | ORAL | Status: DC | PRN

## 2020-08-21 NOTE — Progress Notes (Addendum)
Pt was slow to get up this morning but once he got out of bed, pt was talkative with his peers and spending time in the dayroom. Pt's thoughts are disorganized and thought processes are hard to follow at times. RN assessed for needs/concerns. Pt took medications without incident.  Pt remains safe on the unit with q 15 min safety checks.
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
## 2020-08-21 NOTE — Tx Team (Signed)
Interdisciplinary Treatment and Diagnostic Plan Update  08/21/2020 Time of Session: 10:20am David [name] MRN: [id number]  Principal Diagnosis: Schizophrenia [facility])  Secondary Diagnoses: Principal Problem:   Schizophrenia (HCC) Active Problems:   Aggressive behavior   Current Medications:  Current Facility-Administered Medications  Medication Dose Route Frequency Provider Last Rate Last Admin  . acetaminophen (TYLENOL) tablet 650 mg  650 mg Oral Q6H PRN Money, Gerlene Burdock, FNP      . alum & mag hydroxide-simeth (MAALOX/MYLANTA) 200-200-20 MG/5ML suspension 30 mL  30 mL Oral Q4H PRN Money, Gerlene Burdock, FNP      . ARIPiprazole ER (ABILIFY MAINTENA) injection 400 mg  400 mg Intramuscular Q28 days Antonieta Pert, MD   400 mg at 08/12/20 1735  . benztropine (COGENTIN) tablet 2 mg  2 mg Oral BID PRN Mariel Craft, MD      . diphenhydrAMINE (BENADRYL) capsule 50 mg  50 mg Oral Q6H PRN Mariel Craft, MD   50 mg at 08/20/20 2105   Or  . diphenhydrAMINE (BENADRYL) injection 50 mg  50 mg Intramuscular Q6H PRN Mariel Craft, MD      . divalproex (DEPAKOTE ER) 24 hr tablet 1,000 mg  1,000 mg Oral QHS Mariel Craft, MD   1,000 mg at 08/20/20 2105  . haloperidol (HALDOL) tablet 10 mg  10 mg Oral Q6H PRN Mariel Craft, MD       Or  . haloperidol lactate (HALDOL) injection 10 mg  10 mg Intramuscular Q6H PRN Mariel Craft, MD      . LORazepam (ATIVAN) tablet 2 mg  2 mg Oral Q6H PRN Mariel Craft, MD       Or  . LORazepam (ATIVAN) injection 2 mg  2 mg Intramuscular Q6H PRN Mariel Craft, MD      . LORazepam (ATIVAN) tablet 0.5 mg  0.5 mg Oral TID PRN Antonieta Pert, MD      . risperiDONE (RISPERDAL M-TABS) disintegrating tablet 2 mg  2 mg Oral Q8H PRN Antonieta Pert, MD       And  . LORazepam (ATIVAN) tablet 1 mg  1 mg Oral PRN Antonieta Pert, MD       And  . ziprasidone (GEODON) injection 20 mg  20 mg Intramuscular PRN Antonieta Pert, MD      . magnesium  hydroxide (MILK OF MAGNESIA) suspension 30 mL  30 mL Oral Daily PRN Money, Gerlene Burdock, FNP       PTA Medications: Medications Prior to Admission  Medication Sig Dispense Refill Last Dose  . ARIPiprazole (ABILIFY) 10 MG tablet Take 1 tablet (10 mg total) by mouth daily. For mood control (Patient not taking: Reported on 08/12/2020) 30 tablet 0 Not Taking at Unknown time  . ARIPiprazole ER (ABILIFY MAINTENA) 400 MG SRER injection Inject 2 mLs (400 mg total) into the muscle every 28 (twenty-eight) days. (Due 08-17-18): For mood control (Patient not taking: Reported on 08/12/2020) 1 each 0 Not Taking at Unknown time  . benztropine (COGENTIN) 0.5 MG tablet Take 1 tablet (0.5 mg total) by mouth daily. (Patient not taking: Reported on 08/12/2020) 30 tablet 0 Not Taking at Unknown time  . divalproex (DEPAKOTE) 500 MG DR tablet Take 1 tablet (500 mg total) by mouth 2 (two) times daily. (Patient not taking: Reported on 08/12/2020) 60 tablet 0 Not Taking at Unknown time  . hydrOXYzine (ATARAX/VISTARIL) 25 MG tablet Take 1 tablet (25 mg total) by mouth 2 (two)  times daily. (Patient not taking: Reported on 08/12/2020) 30 tablet 0 Not Taking at Unknown time  . traZODone (DESYREL) 50 MG tablet Take 1 tablet (50 mg total) by mouth at bedtime as needed for sleep. (Patient not taking: Reported on 08/12/2020) 30 tablet 0 Not Taking at Unknown time    Patient Stressors:    Patient Strengths:    Treatment Modalities: Medication Management, Group therapy, Case management,  1 to 1 session with clinician, Psychoeducation, Recreational therapy.   Physician Treatment Plan for Primary Diagnosis: Schizophrenia (HCC) Long Term Goal(s): Improvement in symptoms so as ready for discharge Improvement in symptoms so as ready for discharge   Short Term Goals: Ability to identify changes in lifestyle to reduce recurrence of condition will improve Ability to verbalize feelings will improve Ability to disclose and discuss suicidal  ideas Ability to demonstrate self-control will improve Ability to identify and develop effective coping behaviors will improve Ability to maintain clinical measurements within normal limits will improve Compliance with prescribed medications will improve Ability to identify triggers associated with substance abuse/mental health issues will improve Ability to identify changes in lifestyle to reduce recurrence of condition will improve Ability to verbalize feelings will improve Ability to disclose and discuss suicidal ideas Ability to demonstrate self-control will improve Ability to identify and develop effective coping behaviors will improve Ability to maintain clinical measurements within normal limits will improve Compliance with prescribed medications will improve Ability to identify triggers associated with substance abuse/mental health issues will improve  Medication Management: Evaluate patient's response, side effects, and tolerance of medication regimen.  Therapeutic Interventions: 1 to 1 sessions, Unit Group sessions and Medication administration.  Evaluation of Outcomes: Progressing  Physician Treatment Plan for Secondary Diagnosis: Principal Problem:   Schizophrenia (HCC) Active Problems:   Aggressive behavior  Long Term Goal(s): Improvement in symptoms so as ready for discharge Improvement in symptoms so as ready for discharge   Short Term Goals: Ability to identify changes in lifestyle to reduce recurrence of condition will improve Ability to verbalize feelings will improve Ability to disclose and discuss suicidal ideas Ability to demonstrate self-control will improve Ability to identify and develop effective coping behaviors will improve Ability to maintain clinical measurements within normal limits will improve Compliance with prescribed medications will improve Ability to identify triggers associated with substance abuse/mental health issues will improve Ability to  identify changes in lifestyle to reduce recurrence of condition will improve Ability to verbalize feelings will improve Ability to disclose and discuss suicidal ideas Ability to demonstrate self-control will improve Ability to identify and develop effective coping behaviors will improve Ability to maintain clinical measurements within normal limits will improve Compliance with prescribed medications will improve Ability to identify triggers associated with substance abuse/mental health issues will improve     Medication Management: Evaluate patient's response, side effects, and tolerance of medication regimen.  Therapeutic Interventions: 1 to 1 sessions, Unit Group sessions and Medication administration.  Evaluation of Outcomes: Progressing   RN Treatment Plan for Primary Diagnosis: Schizophrenia (HCC) Long Term Goal(s): Knowledge of disease and therapeutic regimen to maintain health will improve  Short Term Goals: Ability to remain free from injury will improve, Ability to verbalize feelings will improve, Ability to disclose and discuss suicidal ideas and Compliance with prescribed medications will improve  Medication Management: RN will administer medications as ordered by provider, will assess and evaluate patient's response and provide education to patient for prescribed medication. RN will report any adverse and/or side effects to prescribing provider.  Therapeutic Interventions: 1 on 1 counseling sessions, Psychoeducation, Medication administration, Evaluate responses to treatment, Monitor vital signs and CBGs as ordered, Perform/monitor CIWA, COWS, AIMS and Fall Risk screenings as ordered, Perform wound care treatments as ordered.  Evaluation of Outcomes: Progressing   LCSW Treatment Plan for Primary Diagnosis: Schizophrenia (HCC) Long Term Goal(s): Safe transition to appropriate next level of care at discharge, Engage patient in therapeutic group addressing interpersonal  concerns.  Short Term Goals: Engage patient in aftercare planning with referrals and resources, Increase ability to appropriately verbalize feelings, Increase emotional regulation and Increase skills for wellness and recovery  Therapeutic Interventions: Assess for all discharge needs, 1 to 1 time with Social worker, Explore available resources and support systems, Assess for adequacy in community support network, Educate family and significant other(s) on suicide prevention, Complete Psychosocial Assessment, Interpersonal group therapy.  Evaluation of Outcomes: Progressing   Progress in Treatment: Attending groups: No. Participating in groups: No. Taking medication as prescribed: Yes. Toleration medication: Yes. Family/Significant other contact made: Yes, individual(s) contacted:  mother Patient understands diagnosis: No. Discussing patient identified problems/goals with staff: No. Medical problems stabilized or resolved: Yes. Denies suicidal/homicidal ideation: Yes. Issues/concerns per patient self-inventory: No. Other: N/A  New problem(s) identified: No, Describe:  None noted.  New Short Term/Long Term Goal(s):  Safe transition to appropriate next level of care at discharge, Engage patient in therapeutic group addressing interpersonal concerns.   Patient Goals:  Pt refused to attend.  Discharge Plan or Barriers: Pt to return to home. Pt to follow up with outpatient therapy and medication management services.  Reason for Continuation of Hospitalization: Delusions  Hallucinations Mania Medication stabilization  Estimated Length of Stay: 1-3 days  Attendees: Patient:  08/21/2020  Physician:  08/21/2020  Nursing:  08/21/2020   RN Care Manager: 08/21/2020  Social Worker: Ruthann Cancer, LCSW 08/21/2020   Recreational Therapist:  08/21/2020   Other:  08/21/2020   Other:  08/21/2020   Other: 08/21/2020    Scribe for Treatment Team: Otelia Santee, LCSW 08/21/2020 2:41 PM

## 2020-08-22 MED ORDER — ARIPIPRAZOLE 10 MG PO TABS
10.0000 mg | ORAL_TABLET | Freq: Every day | ORAL | Status: DC
  Filled 2020-08-22 (×2): qty 1

## 2020-08-22 MED ORDER — BENZTROPINE MESYLATE 2 MG PO TABS
2.0000 mg | ORAL_TABLET | Freq: Two times a day (BID) | ORAL | 0 refills | Status: DC | PRN
Start: 2020-08-22 — End: 2020-08-28

## 2020-08-22 MED ORDER — ARIPIPRAZOLE 10 MG PO TABS
10.0000 mg | ORAL_TABLET | Freq: Every day | ORAL | 0 refills | Status: DC
Start: 2020-08-22 — End: 2020-08-22

## 2020-08-22 MED ORDER — ARIPIPRAZOLE ER 400 MG IM SRER
400.0000 mg | INTRAMUSCULAR | 0 refills | Status: AC
Start: 2020-09-09 — End: ?

## 2020-08-22 MED ORDER — ARIPIPRAZOLE 10 MG PO TABS: 10.0000 mg | ORAL_TABLET | Freq: Every day | ORAL | 0 refills | Status: DC

## 2020-08-22 MED ORDER — ARIPIPRAZOLE ER 400 MG IM SRER
400.0000 mg | INTRAMUSCULAR | 0 refills | Status: DC
Start: 2020-09-09 — End: 2020-08-22

## 2020-08-22 MED ORDER — DIVALPROEX SODIUM ER 500 MG PO TB24: 1000.0000 mg | ORAL_TABLET | Freq: Every day | ORAL | 0 refills | Status: DC

## 2020-08-22 NOTE — [facility] Suicide Risk Assessment (Signed)
[facility] Discharge Suicide Risk Assessment   Principal Problem: Schizophrenia [facility]) Discharge Diagnoses: Principal Problem:   Schizophrenia (HCC) Active Problems:   Aggressive behavior   Total Time spent with patient: 20 minutes  Musculoskeletal: Strength & Muscle Tone: within normal limits Gait & Station: normal Patient leans: N/A  Psychiatric Specialty Exam: Review of Systems  Psychiatric/Behavioral: Positive for behavioral problems.  All other systems reviewed and are negative.   Blood pressure 106/76, pulse 99, temperature 97.9 F (36.6 C), temperature source Oral, resp. rate 16, SpO2 100 %.There is no height or weight on file to calculate BMI.  General Appearance: Casual  Eye Contact::  Fair  Speech:  Normal Rate409  Volume:  Normal  Mood:  Dysphoric  Affect:  Congruent  Thought Process:  Coherent and Descriptions of Associations: Intact  Orientation:  Full (Time, Place, and Person)  Thought Content:  Delusions  Suicidal Thoughts:  No  Homicidal Thoughts:  No  Memory:  Immediate;   Fair Recent;   Fair Remote;   Fair  Judgement:  Impaired  Insight:  Lacking  Psychomotor Activity:  Normal  Concentration:  Fair  Recall:  [employer] of Knowledge:Fair  Language: Good  Akathisia:  Negative  Handed:  Right  AIMS (if indicated):     Assets:  Desire for Improvement Resilience  Sleep:  Number of Hours: 5.5  Cognition: WNL  ADL's:  Intact   Mental Status Per Nursing Assessment::   On Admission:     Demographic Factors:  Male, Low socioeconomic status, Living alone and Unemployed  Loss Factors: Loss of significant relationship  Historical Factors: Impulsivity  Risk Reduction Factors:   NA  Continued Clinical Symptoms:  Schizophrenia:   Less than 83 years old Paranoid or undifferentiated type  Cognitive Features That Contribute To Risk:  Thought constriction (tunnel vision)    Suicide Risk:  Minimal: No identifiable suicidal ideation.  Patients  presenting with no risk factors but with morbid ruminations; may be classified as minimal risk based on the severity of the depressive symptoms    Plan Of Care/Follow-up recommendations:  Activity:  ad lib  Antonieta Pert, MD 08/22/2020, 2:44 PM

## 2020-08-22 NOTE — Progress Notes (Signed)
  [facility] Adult Case Management Discharge Plan :  Will you be returning to the same living situation after discharge:  No. Will be staying with family or staying in a shelter At discharge, do you have transportation home?: Yes,  mother to pick patient up Do you have the ability to pay for your medications: Yes,  has medicaid  Release of information consent forms completed and in the chart;  Patient's signature needed at discharge.  Patient to Follow up at:  Follow-up Information    [facility]. Go to.   Specialty: Urgent Care Why: Walk in 24/7 for evaluation. Can also provide medication management and therapy.  Contact information: [address] [place] [address] [phone]              Next level of care provider has access to [facility] Link:no  Safety Planning and Suicide Prevention discussed: Yes,  with mother  Have you used any form of tobacco in the last 30 days? (Cigarettes, Smokeless Tobacco, Cigars, and/or Pipes): Patient Refused Screening  Has patient been referred to the Quitline?: Patient refused referral  Patient has been referred for addiction treatment: Pt. refused referral  Otelia Santee, LCSW 08/22/2020, 3:12 PM

## 2020-08-22 NOTE — Progress Notes (Signed)
Pt discharged to lobby. Pt was stable and appreciative at that time. All papers and prescriptions were given and valuables returned. Verbal understanding expressed. Denies SI/HI and A/VH. Pt given opportunity to express concerns and ask questions.  

## 2020-08-22 NOTE — Progress Notes (Signed)
Recreation Therapy Notes  Date: 9.9.21 Time: 0945 Location: [address] Dayroom  Group Topic: Wellness  Goal Area(s) Addresses:  Patient will define components of whole wellness. Patient will verbalize benefit of whole wellness.  Intervention: Music   Activity: Exercise.  LRT led patients in a series of stretches to help get them loose.  Patients would take turns leading the group in any dance moves or exercises of their choosing.  Patients were told to drink water and take breaks as needed.  Education: Wellness, Building control surveyor.   Education Outcome: Acknowledges education/In group clarification offered/Needs additional education.   Clinical Observations/Feedback:  Pt did not attend group session.    [name], LRT/CTRS         Lillia Abed, Prakriti Carignan A 08/22/2020 11:02 AM
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
## 2020-08-27 ENCOUNTER — Emergency Department (HOSPITAL_COMMUNITY): Payer: Medicaid Other

## 2020-08-27 ENCOUNTER — Emergency Department (HOSPITAL_COMMUNITY)
Admission: EM | Admit: 2020-08-27 | Discharge: 2020-08-28 | Disposition: A | Discharge status: Home / Self Care | Payer: Medicaid Other | Attending: Emergency Medicine | Admitting: Emergency Medicine

## 2020-08-27 ENCOUNTER — Other Ambulatory Visit: Payer: Self-pay

## 2020-08-27 ENCOUNTER — Encounter (HOSPITAL_COMMUNITY): Payer: Self-pay | Admitting: Emergency Medicine

## 2020-08-27 DIAGNOSIS — S60229A Contusion of unspecified hand, initial encounter: Secondary | ICD-10-CM

## 2020-08-27 DIAGNOSIS — Y999 Unspecified external cause status: Secondary | ICD-10-CM | POA: Insufficient documentation

## 2020-08-27 DIAGNOSIS — X58XXXA Exposure to other specified factors, initial encounter: Secondary | ICD-10-CM | POA: Diagnosis not present

## 2020-08-27 DIAGNOSIS — Z20822 Contact with and (suspected) exposure to covid-19: Secondary | ICD-10-CM | POA: Diagnosis not present

## 2020-08-27 DIAGNOSIS — Y929 Unspecified place or not applicable: Secondary | ICD-10-CM | POA: Insufficient documentation

## 2020-08-27 DIAGNOSIS — F17210 Nicotine dependence, cigarettes, uncomplicated: Secondary | ICD-10-CM | POA: Insufficient documentation

## 2020-08-27 DIAGNOSIS — F23 Brief psychotic disorder: Secondary | ICD-10-CM | POA: Insufficient documentation

## 2020-08-27 DIAGNOSIS — Y939 Activity, unspecified: Secondary | ICD-10-CM | POA: Diagnosis not present

## 2020-08-27 DIAGNOSIS — S6990XA Unspecified injury of unspecified wrist, hand and finger(s), initial encounter: Secondary | ICD-10-CM | POA: Diagnosis present

## 2020-08-27 LAB — CBC
HCT: 40.4 % (ref 39.0–52.0)
Hemoglobin: 12.9 g/dL — ABNORMAL LOW (ref 13.0–17.0)
MCH: 27.2 pg (ref 26.0–34.0)
MCHC: 31.9 g/dL (ref 30.0–36.0)
MCV: 85.1 fL (ref 80.0–100.0)
Platelets: 265 10*3/uL (ref 150–400)
RBC: 4.75 MIL/uL (ref 4.22–5.81)
RDW: 12.8 % (ref 11.5–15.5)
WBC: 11.7 10*3/uL — ABNORMAL HIGH (ref 4.0–10.5)
nRBC: 0 % (ref 0.0–0.2)

## 2020-08-27 LAB — ACETAMINOPHEN LEVEL: Acetaminophen (Tylenol), Serum: <10 ug/mL — ABNORMAL LOW (ref 10–30)

## 2020-08-27 LAB — COMPREHENSIVE METABOLIC PANEL
ALT: 18 U/L (ref 0–44)
AST: 26 U/L (ref 15–41)
Albumin: 4.6 g/dL (ref 3.5–5.0)
Alkaline Phosphatase: 46 U/L (ref 38–126)
Anion gap: 12 (ref 5–15)
BUN: <5 mg/dL — ABNORMAL LOW (ref 6–20)
CO2: 26 mmol/L (ref 22–32)
Calcium: 9.7 mg/dL (ref 8.9–10.3)
Chloride: 100 mmol/L (ref 98–111)
Creatinine, Ser: 1.12 mg/dL (ref 0.61–1.24)
GFR calc Af Amer: >60 mL/min (ref >60)
GFR calc non Af Amer: >60 mL/min (ref >60)
Glucose, Bld: 90 mg/dL (ref 70–99)
Potassium: 3.6 mmol/L (ref 3.5–5.1)
Sodium: 138 mmol/L (ref 135–145)
Total Bilirubin: 0.9 mg/dL (ref 0.3–1.2)
Total Protein: 7.4 g/dL (ref 6.5–8.1)

## 2020-08-27 LAB — RAPID URINE DRUG SCREEN, HOSP PERFORMED
Amphetamines: NOT DETECTED
Barbiturates: NOT DETECTED
Benzodiazepines: NOT DETECTED
Cocaine: NOT DETECTED
Opiates: NOT DETECTED
Tetrahydrocannabinol: NOT DETECTED

## 2020-08-27 LAB — ETHANOL: Alcohol, Ethyl (B): <10 mg/dL (ref <10)

## 2020-08-27 LAB — SARS CORONAVIRUS 2 BY RT PCR (HOSPITAL ORDER, PERFORMED IN ~~LOC~~ HOSPITAL LAB): SARS Coronavirus 2: NEGATIVE

## 2020-08-27 LAB — SALICYLATE LEVEL: Salicylate Lvl: <7 mg/dL — ABNORMAL LOW (ref 7.0–30.0)
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
## 2020-08-27 NOTE — ED Notes (Signed)
Mother's Name: Anthonette Legato- [phone]

## 2020-08-27 NOTE — ED Provider Notes (Signed)
After leaving AGAINST MEDICAL ADVICE the patient came back, psychiatric orders are in, patient is willing to stay at this time, vital signs are stable, see my prior note with medical decision making regarding plan   Eber Hong, MD 08/27/20 [id number]

## 2020-08-27 NOTE — ED Notes (Signed)
Hands wrapped with gauze wrap and antibiotic ointment per MD verbal orders.
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
## 2020-08-27 NOTE — ED Notes (Signed)
Patient is resting comfortably. 

## 2020-08-27 NOTE — Discharge Instructions (Signed)
You should keep your hands clean with soap and water, apply a topical antibiotic ointment if needed to any sores or wounds on your hand but return to the emergency department for severe or worsening swelling redness pain or fever.

## 2020-08-28 ENCOUNTER — Ambulatory Visit (HOSPITAL_COMMUNITY)
Admission: EM | Admit: 2020-08-28 | Discharge: 2020-08-28 | Disposition: A | Discharge status: Home / Self Care | Payer: Medicaid Other | Attending: Psychiatry | Admitting: Psychiatry

## 2020-08-28 ENCOUNTER — Encounter (HOSPITAL_COMMUNITY): Payer: Self-pay

## 2020-08-28 ENCOUNTER — Other Ambulatory Visit: Payer: Self-pay

## 2020-08-28 DIAGNOSIS — Z91128 Patient's intentional underdosing of medication regimen for other reason: Secondary | ICD-10-CM | POA: Insufficient documentation

## 2020-08-28 DIAGNOSIS — G4700 Insomnia, unspecified: Secondary | ICD-10-CM | POA: Insufficient documentation

## 2020-08-28 DIAGNOSIS — T443X6A Underdosing of other parasympatholytics [anticholinergics and antimuscarinics] and spasmolytics, initial encounter: Secondary | ICD-10-CM | POA: Insufficient documentation

## 2020-08-28 DIAGNOSIS — F17210 Nicotine dependence, cigarettes, uncomplicated: Secondary | ICD-10-CM | POA: Insufficient documentation

## 2020-08-28 DIAGNOSIS — F209 Schizophrenia, unspecified: Secondary | ICD-10-CM | POA: Insufficient documentation

## 2020-08-28 DIAGNOSIS — T43596A Underdosing of other antipsychotics and neuroleptics, initial encounter: Secondary | ICD-10-CM | POA: Insufficient documentation

## 2020-08-28 MED ORDER — ACETAMINOPHEN 325 MG PO TABS: 650.0000 mg | ORAL_TABLET | Freq: Four times a day (QID) | ORAL | Status: DC | PRN

## 2020-08-28 MED ORDER — BENZTROPINE MESYLATE 1 MG PO TABS
2.0000 mg | ORAL_TABLET | Freq: Two times a day (BID) | ORAL | Status: DC | PRN
  Filled 2020-08-28 (×2): qty 28

## 2020-08-28 MED ORDER — HYDROXYZINE HCL 25 MG PO TABS
25.0000 mg | ORAL_TABLET | Freq: Three times a day (TID) | ORAL | Status: DC | PRN
  Administered 2020-08-28 (×2): 25 mg via ORAL
  Filled 2020-08-28 (×2): qty 1

## 2020-08-28 MED ORDER — ARIPIPRAZOLE ER 400 MG IM SRER: 400.0000 mg | INTRAMUSCULAR | Status: DC

## 2020-08-28 MED ORDER — DIVALPROEX SODIUM ER 500 MG PO TB24
1000.0000 mg | ORAL_TABLET | Freq: Every day | ORAL | 0 refills | Status: AC
Start: 2020-08-28 — End: ?

## 2020-08-28 MED ORDER — BENZTROPINE MESYLATE 2 MG PO TABS
2.0000 mg | ORAL_TABLET | Freq: Two times a day (BID) | ORAL | 0 refills | Status: AC | PRN
Start: 2020-08-28 — End: ?

## 2020-08-28 MED ORDER — ARIPIPRAZOLE 10 MG PO TABS: 10.0000 mg | ORAL_TABLET | Freq: Every day | ORAL | 0 refills | Status: AC

## 2020-08-28 MED ORDER — MAGNESIUM HYDROXIDE 400 MG/5ML PO SUSP: 30.0000 mL | Freq: Every day | ORAL | Status: DC | PRN

## 2020-08-28 MED ORDER — DIVALPROEX SODIUM ER 500 MG PO TB24
1000.0000 mg | ORAL_TABLET | Freq: Every day | ORAL | Status: DC
  Filled 2020-08-28: qty 14

## 2020-08-28 MED ORDER — ALUM & MAG HYDROXIDE-SIMETH 200-200-20 MG/5ML PO SUSP: 30.0000 mL | ORAL | Status: DC | PRN

## 2020-08-28 MED ORDER — ARIPIPRAZOLE 10 MG PO TABS
10.0000 mg | ORAL_TABLET | Freq: Every day | ORAL | Status: DC
  Administered 2020-08-28: 10 mg via ORAL
  Filled 2020-08-28: qty 7
  Filled 2020-08-28: qty 1

## 2020-08-28 MED ORDER — HYDROXYZINE HCL 25 MG PO TABS: 25.0000 mg | ORAL_TABLET | Freq: Three times a day (TID) | ORAL | 0 refills | Status: AC | PRN

## 2020-08-28 MED FILL — BENZTROPINE MES 2 MG TABLET: 2 | 15 days supply | Qty: 30 | Fill #0

## 2020-08-28 MED FILL — ARIPIPRAZOLE 10 MG TABS: 10 | 30 days supply | Qty: 30 | Fill #0

## 2020-08-28 MED FILL — DIVALPROEX SOD ER 500 MG TA: 500 | 30 days supply | Qty: 60 | Fill #0

## 2020-08-28 MED FILL — hydrOXYzine HCL 25 MG TABS: 25 | 10 days supply | Qty: 30 | Fill #0

## 2020-08-28 NOTE — Discharge Instructions (Addendum)
You are encouraged to follow up with [facility] on Monday (9/20) at 8 AM for a walk in appointment.  After meeting with the provider on 9/20, you will be able to come for your scheduled injection on 9/27 without needing to see a provider.   [facility] [address] [place], [place] [phone]  Activity as tolerated. Diet as recommended by primary care physician. Keep all scheduled follow-up appointments as recommended.

## 2020-08-28 NOTE — ED Notes (Signed)
TTS being done at this time.  
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
## 2020-08-28 NOTE — ED Notes (Addendum)
D: Pt alert and oriented on the unit.   A: Education, support, and encouragement provided. Discharge summary, medications and follow up appointments reviewed with pt. Suicide prevention resources provided. Pt's belongings in returned and belongings sheet signed.  R: Pt denies SI/HI, A/VH or any concerns at this time. Pt ambulatory on and off unit. Pt discharged to lobby to be transported to his brother via Psychologist, educational.
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
## 2020-08-28 NOTE — ED Notes (Signed)
Pt belongings in locker #27  

## 2020-08-28 NOTE — ED Notes (Signed)
Pt asleep with even and unlabored respirations. No distress or discomfort noted. Pt remains safe on the unit. Will continue to monitor. 

## 2020-08-28 NOTE — ED Notes (Signed)
Pt discharged in no acute distress. Denies SI/HI upon discharge. Verbalized understanding of all discharge instructions. All belongings returned to pt intact from locker #27. Pt escorted to outside [place] for safe transport service to carry pt to his destination. Safety maintained.

## 2020-08-28 NOTE — ED Provider Notes (Signed)
TTS note appreciated.  Patient is to be transferred to [facility] urgent care.   Dione Booze, MD 08/28/20 [phone]

## 2020-08-28 NOTE — ED Notes (Signed)
Pt refusing to change into scrubs at this time
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
## 2021-08-01 ENCOUNTER — Emergency Department (HOSPITAL_COMMUNITY)
Admission: EM | Admit: 2021-08-01 | Discharge: 2021-08-04 | Payer: Medicaid Other | Attending: Emergency Medicine | Admitting: Emergency Medicine

## 2021-08-01 ENCOUNTER — Other Ambulatory Visit: Payer: Self-pay

## 2021-08-01 ENCOUNTER — Encounter (HOSPITAL_COMMUNITY): Payer: Self-pay

## 2021-08-01 DIAGNOSIS — F209 Schizophrenia, unspecified: Secondary | ICD-10-CM | POA: Insufficient documentation

## 2021-08-01 DIAGNOSIS — F99 Mental disorder, not otherwise specified: Secondary | ICD-10-CM | POA: Diagnosis present

## 2021-08-01 DIAGNOSIS — Z20822 Contact with and (suspected) exposure to covid-19: Secondary | ICD-10-CM | POA: Diagnosis not present

## 2021-08-01 DIAGNOSIS — F17210 Nicotine dependence, cigarettes, uncomplicated: Secondary | ICD-10-CM | POA: Insufficient documentation

## 2021-08-01 DIAGNOSIS — Y900 Blood alcohol level of less than 20 mg/100 ml: Secondary | ICD-10-CM | POA: Diagnosis not present

## 2021-08-01 DIAGNOSIS — Z79899 Other long term (current) drug therapy: Secondary | ICD-10-CM | POA: Insufficient documentation

## 2021-08-01 NOTE — ED Provider Notes (Signed)
[facility]EM[facility]GENCY DEPT Provider Note   CSN: [id number] Arrival date & time: 08/01/21  2202     History Chief Complaint  Patient presents with   Psychiatric Evaluation    LEVEL 5 CAVEAT 2/2 PSYCHIATRIC D/O  David [name] is a 25 y.o. male.  25 year old male with a history of psychosis and suicidal ideation presents to the ED via EMS after father called stating the patient was not acting like himself.  Father reported to paramedics that patient's symptoms had been worsening over the past 2 weeks.  Patient is unwilling to contribute to history.  Mother did call the hospital and report that he has been battling homelessness and living in a car in his aunts yard.  On no longer wants him in the house, therefore patient has not been wanting to leave the car.  He has allegedly been urinating and defecating within the vehicle.  Only eats 1 father brings him meals.  He has had severe reactions to medications in the past and does not take his prescribed medicines.  History of bipolar, schizophrenia, situational mute.   The history is provided by a parent. No language interpreter was used.      Past Medical History:  Diagnosis Date   Psychosis (HCC)    Suicidal ideation     Patient Active Problem List   Diagnosis Date Noted   Schizophrenia (HCC) 08/12/2020   Schizophrenia, paranoid (HCC) 07/15/2018   Schizoaffective disorder, bipolar type (HCC) 05/28/2018   Bipolar I disorder, most recent episode (or current) manic (HCC) 10/14/2017   Psychosis (HCC) 10/13/2017   Avulsion of left ear 08/24/2016   Multiple fractures of cervical spine (HCC) 08/24/2016   Mesenteric hematoma 08/24/2016   Serosal tear of colon 08/24/2016   Multiple abrasions 08/24/2016   Open fracture of right tibia and fibula 08/24/2016   Multiple closed tarsal fractures of left foot 08/24/2016   Acute blood loss anemia 08/24/2016   Acute urinary retention 08/24/2016   Blunt trauma 08/22/2016    Aggressive behavior     Past Surgical History:  Procedure Laterality Date   APPLICATION OF WOUND VAC Right 08/23/2016   Procedure: WOUND VAC EXCHANGE;  Surgeon: Tarry Kos, MD;  Location: [facility] OR;  Service: Orthopedics;  Laterality: Right;   EXTERNAL FIXATION LEG  08/22/2016   Procedure: POSSIBLE EXTERNAL FIXATION LEG;  Surgeon: Gaynelle Adu, MD;  Location: [facility] OR;  Service: General;;   EXTERNAL FIXATION REMOVAL Right 08/23/2016   Procedure: REMOVAL EXTERNAL FIXATION LEG;  Surgeon: Tarry Kos, MD;  Location: [facility] OR;  Service: Orthopedics;  Laterality: Right;   I & D EXTREMITY Right 08/22/2016   Procedure: IRRIGATION AND DEBRIDEMENT RIGHT LEG AND CASTING;  Surgeon: Gaynelle Adu, MD;  Location[facility] OR;  Service: General;  Laterality: Right;   LAPAROSCOPY N/A 08/22/2016   Procedure: LAPAROSCOPY DIAGNOSTIC;  Surgeon: Gaynelle Adu, MD;  Location: [facility] OR;  Service: General;  Laterality: N/A;   LAPAROTOMY N/A 08/22/2016   Procedure: Exploratory LAPAROTOMY repair of right colon serosal tear - pre-op blunt abdominal trauma;  Surgeon: Gaynelle Adu, MD;  Location: [facility] OR;  Service: General;  Laterality: N/A;   OTOPLASATY Left 08/22/2016   Procedure: REPAIR OF LEFT EAR;  Surgeon: Gaynelle Adu, MD;  Location: [facility] OR;  Service: General;  Laterality: Left;   TIBIA IM NAIL INSERTION Right 08/23/2016   Procedure: INTRAMEDULLARY (IM) NAIL TIBIAL;  Surgeon: Tarry Kos, MD;  Location: [facility] OR;  Service: Orthopedics;  Laterality: Right;  History reviewed. No pertinent family history.  Social History   Tobacco Use   Smoking status: Every Day    Packs/day: 1.00    Types: Cigarettes   Smokeless tobacco: Never  Vaping Use   Vaping Use: Never used  Substance Use Topics   Alcohol use: Yes    Comment: last  drank beer 12-24-17   Drug use: No    Home Medications Prior to Admission medications   Medication Sig Start Date End Date Taking? Authorizing Provider  ARIPiprazole (ABILIFY) 10 MG tablet Take 1 tablet (10 mg  total) by mouth daily. For mood control 08/28/20   Aldean Baker, NP  ARIPiprazole ER (ABILIFY MAINTENA) 400 MG SRER injection Inject 2 mLs (400 mg total) into the muscle every 28 (twenty-eight) days. (Due on 09-09-20): For mood control Patient not taking: Reported on 08/28/2020 09/09/20   Armandina Stammer I, NP  benztropine (COGENTIN) 2 MG tablet Take 1 tablet (2 mg total) by mouth 2 (two) times daily as needed for tremors (eps). 08/28/20   Aldean Baker, NP  divalproex (DEPAKOTE ER) 500 MG 24 hr tablet Take 2 tablets (1,000 mg total) by mouth at bedtime. 08/28/20   Aldean Baker, NP  hydrOXYzine (ATARAX/VISTARIL) 25 MG tablet Take 1 tablet (25 mg total) by mouth 3 (three) times daily as needed for anxiety. 08/28/20   Aldean Baker, NP    Allergies    Patient has no known allergies.  Review of Systems   Review of Systems  Unable to perform ROS: Psychiatric disorder    Physical Exam Updated Vital Signs BP 114/84 (BP Location: Right Arm)   Pulse 64   Temp 98.4 F (36.9 C) (Oral)   Resp 16   Ht 6' (1.829 m)   Wt 75.8 kg   SpO2 99%   BMI 22.66 kg/m   Physical Exam Vitals and nursing note reviewed.  Constitutional:      General: He is not in acute distress.    Appearance: He is well-developed. He is not diaphoretic.  HENT:     Head: Normocephalic and atraumatic.  Eyes:     General: No scleral icterus.    Conjunctiva/sclera: Conjunctivae normal.  Pulmonary:     Effort: Pulmonary effort is normal. No respiratory distress.  Musculoskeletal:        General: Normal range of motion.     Cervical back: Normal range of motion.  Skin:    General: Skin is warm and dry.     Coloration: Skin is not pale.     Findings: No erythema or rash.  Neurological:     Mental Status: He is alert and oriented to person, place, and time.  Psychiatric:        Mood and Affect: Affect is flat.        Speech: He is noncommunicative.        Behavior: Behavior is uncooperative.    ED Results /  Procedures / Treatments   Labs (all labs ordered are listed, but only abnormal results are displayed) Labs Reviewed  RESP PANEL BY RT-PCR (FLU A&B, COVID) ARPGX2  CBC WITH DIFFERENTIAL/PLATELET  COMPREHENSIVE METABOLIC PANEL  ETHANOL  RAPID URINE DRUG SCREEN, HOSP PERFORMED    EKG None  Radiology No results found.  Procedures Procedures   Medications Ordered in ED Medications - No data to display  ED Course  I have reviewed the triage vital signs and the nursing notes.  Pertinent labs & imaging results that were available during  my care of the patient were reviewed by me and considered in my medical decision making (see chart for details).  Clinical Course as of 08/02/21 4142  Caleen Essex Aug 01, 2021  2357 Benetta Spar (mother) can be reached at [phone] [KH]  Sat Aug 02, 2021  0126 IVC papers initiated. [KH]    Clinical Course User Index [KH] Darylene Price   MDM Rules/Calculators/A&P                           25 year old male presents to the emergency department due to concern for psychiatric crisis.  He is homeless and has been noncompliant with his medications.  Reportedly living in a car on his aunts property.  Does have a history of situational mute.  Will not contribute to history while in the emergency department.  Decision was made to proceed with involuntary commitment based on history given by mother.  Pending TTS assessment as well as review of labs for formal clearance.  Disposition to be determined by oncoming ED provider.   Final Clinical Impression(s) / ED Diagnoses Final diagnoses:  Psychiatric disorder    Rx / DC Orders ED Discharge Orders     None        Antony Madura, PA-C 08/02/21 0603    Glynn Octave, MD 08/02/21 [phone]

## 2021-08-01 NOTE — ED Triage Notes (Signed)
Pt to ED via [facility] from home after father called stating pt is not acting himself, worsening over last two weeks.  Pt denies substance use.  Pt refusing to answer most questions.

## 2021-08-01 NOTE — ED Notes (Signed)
Pt's mother called.  States pt "has been battling with homelessness" and living in a car in his Aunt's yard.  The Aunt no longer lets him in the house and is telling him she wants him out of her yard as well.  Father brings pt meals to the car.  Pt does not want to leave the car, urinates and defecates on self.  Pt had severe reactions to medications in past and does not take them.  Hx of bipolar, schizophrenia, and situational mute.    [place]:  [phone]

## 2021-08-02 LAB — COMPREHENSIVE METABOLIC PANEL
ALT: 13 U/L (ref 0–44)
AST: 14 U/L — ABNORMAL LOW (ref 15–41)
Albumin: 4.3 g/dL (ref 3.5–5.0)
Alkaline Phosphatase: 46 U/L (ref 38–126)
Anion gap: 10 (ref 5–15)
BUN: 11 mg/dL (ref 6–20)
CO2: 27 mmol/L (ref 22–32)
Calcium: 9.8 mg/dL (ref 8.9–10.3)
Chloride: 105 mmol/L (ref 98–111)
Creatinine, Ser: 0.78 mg/dL (ref 0.61–1.24)
GFR, Estimated: >60 mL/min (ref >60)
Glucose, Bld: 89 mg/dL (ref 70–99)
Potassium: 3.8 mmol/L (ref 3.5–5.1)
Sodium: 142 mmol/L (ref 135–145)
Total Bilirubin: 0.4 mg/dL (ref 0.3–1.2)
Total Protein: 7.6 g/dL (ref 6.5–8.1)

## 2021-08-02 LAB — CBC WITH DIFFERENTIAL/PLATELET
Abs Immature Granulocytes: 0.01 10*3/uL (ref 0.00–0.07)
Basophils Absolute: 0 10*3/uL (ref 0.0–0.1)
Basophils Relative: 1 %
Eosinophils Absolute: 0.4 10*3/uL (ref 0.0–0.5)
Eosinophils Relative: 7 %
HCT: 44.5 % (ref 39.0–52.0)
Hemoglobin: 14.7 g/dL (ref 13.0–17.0)
Immature Granulocytes: 0 %
Lymphocytes Relative: 39 %
Lymphs Abs: 2.5 10*3/uL (ref 0.7–4.0)
MCH: 28.2 pg (ref 26.0–34.0)
MCHC: 33 g/dL (ref 30.0–36.0)
MCV: 85.2 fL (ref 80.0–100.0)
Monocytes Absolute: 0.7 10*3/uL (ref 0.1–1.0)
Monocytes Relative: 11 %
Neutro Abs: 2.8 10*3/uL (ref 1.7–7.7)
Neutrophils Relative %: 42 %
Platelets: 267 10*3/uL (ref 150–400)
RBC: 5.22 MIL/uL (ref 4.22–5.81)
RDW: 12.2 % (ref 11.5–15.5)
WBC: 6.4 10*3/uL (ref 4.0–10.5)
nRBC: 0 % (ref 0.0–0.2)

## 2021-08-02 LAB — RESP PANEL BY RT-PCR (FLU A&B, COVID) ARPGX2
Influenza A by PCR: NEGATIVE
Influenza B by PCR: NEGATIVE
SARS Coronavirus 2 by RT PCR: NEGATIVE

## 2021-08-02 LAB — ETHANOL: Alcohol, Ethyl (B): <10 mg/dL (ref <10)

## 2021-08-02 NOTE — Progress Notes (Signed)
Per Leroy Sea, patient meets criteria for inpatient treatment. There are no available or appropriate beds at [facility] today. CSW faxed referrals to the following facilities for review:   [facility]  Pending - Request Sent N/A [address] [place] [place] [address] [phone] --  [facility]  Pending - Request Sent N/A [address], [place] [address] [phone] --  [facility]  Pending - Request Sent N/A [address] [place] [address] [phone] --  [facility]  Pending - Request Sent N/A [address], [place] [address] [phone] --  [facility]  Pending - Request Sent N/A [facility] [address] [place] [address] [phone] --  [facility]  Pending - Request Sent N/A [address] [place] [address] [phone] --  [facility]  Pending - Request Sent N/A [address] [place] [address] [phone] --  [facility]  Pending - Request Sent N/A [address] [place], [place] [address] [phone] --  [facility]  Pending - Request Sent N/A [address] [place] [address] [phone] --  [facility]  Pending - Request Sent N/A [address], [place] [address] [phone] --  [facility]  Pending - Request Sent N/A [address] [place] [address] [phone] --  [facility]  Pending - Request Sent N/A [address], [place] [address] [phone] --  [facility]  Pending - Request Sent N/A [address] [place] [address] [phone] --  [facility]  Pending - Request Sent  N/A [address] [place] [address] [phone] --  [facility]  Pending - Request Sent N/A [address] [place] [address] [phone] --  [facility]  Pending - Request Sent N/A [address], [place] [address] [phone] --  [facility]  Pending - Request Sent N/A [address] [place] [address] [phone] --  [facility]  Pending - Request Sent N/A [address] [place] [address] [phone] --  [facility]  Pending - Request Sent N/A [address] [place] [address] [phone] --  [facility]  Pending - Request Sent N/A [address], [place] [address] [phone] --  [facility]  Pending - Request Sent N/A [address] [place] [address] [phone] --  [facility]  Pending - Request Sent N/A [address] [place], [place] [address] [phone] --  [facility]  Pending - Request Sent N/A [address] [place] [address] [phone] --  [facility]  Pending - Request Sent N/A [address], [place] [address] [phone] --  [facility]  Pending - Request Sent N/A [address] [place], [place] [address] [phone] --  [facility]  Pending - Request Sent N/A [address] [place] [address] [phone] --  [facility]  Pending - Request Sent N/A [address], [place] [address] [phone] --   TTS will continue to seek bed placement.  Crissie Reese, MSW, LCSW-A, LCAS-A Phone:  [phone] Disposition/TOC

## 2021-08-02 NOTE — ED Notes (Signed)
Breakfast tray given. °

## 2021-08-02 NOTE — ED Notes (Signed)
Gave bedside report to Sherian Rein, RN

## 2021-08-02 NOTE — ED Notes (Signed)
Patient got up, walked to EMS bay doors and sat back down in bed. Not talking to staff.

## 2021-08-02 NOTE — BH Assessment (Signed)
Comprehensive Clinical Assessment (CCA) Note  08/02/2021 David [name] [id number] DISPOSITION: Leevy-Johnson NP recommends a inpatient admission to assist with stabilization.   Flowsheet Row ED from 08/01/2021 in [facility] from 08/28/2020 in [facility] ([facility]) [facility]  C-SSRS RISK CATEGORY No Risk No Risk Error: Question 2 not populated      The patient demonstrates the following risk factors for suicide: Chronic risk factors for suicide include: N/A. Acute risk factors for suicide include: N/A. Protective factors for this patient include: coping skills. Considering these factors, the overall suicide risk at this point appears to be low. Patient is not appropriate for outpatient follow up. Per chart review patient has a history of Schizophrenia. Per chart review patient was last admitted to [facility] in 2021. See discharge summary from 08/28/20 by Gerilyn Pilgrim NP for additional information. It is unclear if patient currently is prescribed any psychiatric medications. Patient's mother could not be reached this date for collateral information.    Patient is a 25 year old male that presents this date with IVC for ongoing psychosis. Patient will not participate in the assessment although shakes his head no when asked in reference to S/I, H/I or AVH. This Clinical research associate is uncertain if patient is comprehending the content of this writer's questions. Patient is currently situational mute. Information to complete assessment was obtained from admission notes and chart review. Patient's UDS is pending although patient does not have a SA history per chart review.    Per [place] PA note on arrival: 25 year old male with a history of psychosis and suicidal ideation presents to the ED via EMS after father called stating the patient was not acting like himself.  Father reported to paramedics that  patient's symptoms had been worsening over the past 2 weeks.  Patient is unwilling to contribute to history.  Mother did call the hospital and report that he has been battling homelessness and living in a car in his aunts yard.  On no longer wants him in the house, therefore patient has not been wanting to leave the car.  He has allegedly been urinating and defecating within the vehicle.  Only eats 1 father brings him meals.  He has had severe reactions to medications in the past and does not take his prescribed medicines.  History of bipolar, schizophrenia, situational mute.   Pray RN also notes: Pt's mother called.  States pt "has been battling with homelessness" and living in a car in his Aunt's yard.  The Aunt no longer lets him in the house and is telling him she wants him out of her yard as well.  Father brings pt meals to the car.  Pt does not want to leave the car, urinates and defecates on self.  Pt had severe reactions to medications in past and does not take them.  Hx of bipolar, schizophrenia, and situational mute.    Patient will not respond to orientation questions. Patient per notes initially presented with ongoing psychosis. Patient is observed to be staring straight ahead and will not respond to this writer's questions. Patient does not appear to be responding to internal stimuli.    Chief Complaint:  Chief Complaint  Patient presents with   Psychiatric Evaluation   Visit Diagnosis: Schizophrenia     CCA Screening, Triage and Referral (STR)  Patient Reported Information How did you hear about [place]? Self  What Is the Reason for Your Visit/Call Today?  Pt is brought in by [facility] with IVC for psychosis.  How Long Has This Been Causing You Problems? -- (UTA)  What Do You Feel Would Help You the Most Today? -- (UTA)   Have You Recently Had Any Thoughts About Hurting Yourself? No  Are You Planning to Commit Suicide/Harm Yourself At This time? No   Have you Recently Had Thoughts  About Hurting Someone [name]? No  Are You Planning to Harm Someone at This Time? No  Explanation: No data recorded  Have You Used Any Alcohol or Drugs in the Past 24 Hours? No  How Long Ago Did You Use Drugs or Alcohol? No data recorded What Did You Use and How Much? No data recorded  Do You Currently Have a Therapist/Psychiatrist? No  Name of Therapist/Psychiatrist: No data recorded  Have You Been Recently Discharged From Any Office Practice or Programs? No  Explanation of Discharge From Practice/Program: No data recorded    CCA Screening Triage Referral Assessment Type of Contact: Face-to-Face  Telemedicine Service Delivery:   Is this Initial or Reassessment? Initial Assessment  Date Telepsych consult ordered in [facility]:  08/28/20  Time Telepsych consult ordered in [facility]:  0142  Location of Assessment: [facility]  Provider Location: Other (comment) ([facility])   Collateral Involvement: None at this time. Mother was unable to be resched at the time of assessment.   Does Patient Have a Automotive engineer Guardian? No data recorded Name and Contact of Legal Guardian: No data recorded If Minor and Not Living with Parent(s), Who has Custody? NA  Is CPS involved or ever been involved? Never  Is APS involved or ever been involved? Never   Patient Determined To Be At Risk for Harm To Self or Others Based on Review of Patient Reported Information or Presenting Complaint? No  Method: No data recorded Availability of Means: No data recorded Intent: No data recorded Notification Required: No data recorded Additional Information for Danger to Others Potential: No data recorded Additional Comments for Danger to Others Potential: No data recorded Are There Guns or Other Weapons in Your Home? No data recorded Types of Guns/Weapons: No data recorded Are These Weapons Safely Secured?                            No data recorded Who Could Verify You Are Able To Have These Secured: No data  recorded Do You Have any Outstanding Charges, Pending Court Dates, Parole/Probation? No data recorded Contacted To Inform of Risk of Harm To Self or Others: Other: Comment (NA)    Does Patient Present under Involuntary Commitment? Yes  IVC Papers Initial File Date: 08/02/21   [place] of Residence: [place]   Patient Currently Receiving the Following Services: Not Receiving Services   Determination of Need: Emergent (2 hours)   Options For Referral: Medication Management     CCA Biopsychosocial Patient Reported Schizophrenia/Schizoaffective Diagnosis in Past: Yes   Strengths: UTA   Mental Health Symptoms Depression:   -- (UTA)   Duration of Depressive symptoms:    Mania:   -- (UTA)   Anxiety:    -- (UTA)   Psychosis:   Grossly disorganized or catatonic behavior   Duration of Psychotic symptoms:  Duration of Psychotic Symptoms: Less than six months   Trauma:   None   Obsessions:   None   Compulsions:   None   Inattention:   None   Hyperactivity/Impulsivity:   None  Oppositional/Defiant Behaviors:   None   Emotional Irregularity:   -- (UTA)   Other Mood/Personality Symptoms:   UTA    Mental Status Exam Appearance and self-care  Stature:   Average   Weight:   Average weight   Clothing:   Disheveled   Grooming:   Neglected   Cosmetic use:   None   Posture/gait:   Bizarre   Motor activity:   Agitated   Sensorium  Attention:   Distractible   Concentration:   Preoccupied   Orientation:   -- (UTA)   Recall/memory:   Defective in Immediate   Affect and Mood  Affect:   Anxious   Mood:   Anxious   Relating  Eye contact:   Fleeting   Facial expression:   Anxious   Attitude toward examiner:   Uninterested   Thought and Language  Speech flow:  Slow   Thought content:   Suspicious   Preoccupation:   None   Hallucinations:   -- (UTA)   Organization:  No data recorded  [employer]  of Knowledge:   Poor   Intelligence:   Needs investigation   Abstraction:   -- (UTA)   Judgement:   -- (UTA)   Reality Testing:   -- (UTA)   Insight:   -- (UTA)   Decision Making:   -- (UTA)   Social Functioning  Social Maturity:   -- Industrial/product designer)   Social Judgement:   -- Industrial/product designer)   Stress  Stressors:   -- (UTA)   Coping Ability:   -- Industrial/product designer)   Skill Deficits:   -- Industrial/product designer)   Supports:   Usual     Religion: Religion/Spirituality Are You A Religious Person?:  (UTA) How Might This Affect Treatment?: UTA  Leisure/Recreation: Leisure / Recreation Do You Have Hobbies?:  (UTA)  Exercise/Diet: Exercise/Diet Do You Exercise?:  (UTA) Have You Gained or Lost A Significant Amount of Weight in the Past Six Months?:  (UTA) Do You Follow a Special Diet?:  (UTA) Do You Have Any Trouble Sleeping?:  (UTA)   CCA Employment/Education Employment/Work Situation: Employment / Work Situation Employment Situation: Unemployed Patient's Job has Been Impacted by Current Illness: No Has Patient ever Been in Equities trader?: No  Education: Education Did You Have An Individualized Education Program (IIEP): No Did You Have Any Difficulty At [employer]?: No Patient's Education Has Been Impacted by Current Illness: No   CCA Family/Childhood History Family and Relationship History: Family history Does patient have children?: No  Childhood History:  Childhood History By whom was/is the patient raised?: Mother Did patient suffer any verbal/emotional/physical/sexual abuse as a child?: No Has patient ever been sexually abused/assaulted/raped as an adolescent or adult?: No Witnessed domestic violence?: No Has patient been affected by domestic violence as an adult?: No  Child/Adolescent Assessment:     CCA Substance Use Alcohol/Drug Use: Alcohol / Drug Use Pain Medications: See MAR Prescriptions: See MAR Over the Counter: See MAR History of alcohol / drug use?: No history of  alcohol / drug abuse Negative Consequences of Use:  (Denies) Withdrawal Symptoms:  (Denies)                         ASAM's:  Six Dimensions of Multidimensional Assessment  Dimension 1:  Acute Intoxication and/or Withdrawal Potential:      Dimension 2:  Biomedical Conditions and Complications:      Dimension 3:  Emotional, Behavioral, or Cognitive Conditions and Complications:  Dimension 4:  Readiness to Change:     Dimension 5:  Relapse, Continued use, or Continued Problem Potential:     Dimension 6:  Recovery/Living Environment:     ASAM Severity Score:    ASAM Recommended Level of Treatment:     Substance use Disorder (SUD)    Recommendations for Services/Supports/Treatments:    Discharge Disposition:    DSM5 Diagnoses: Patient Active Problem List   Diagnosis Date Noted   Schizophrenia (HCC) 08/12/2020   Schizophrenia, paranoid (HCC) 07/15/2018   Schizoaffective disorder, bipolar type (HCC) 05/28/2018   Bipolar I disorder, most recent episode (or current) manic (HCC) 10/14/2017   Psychosis (HCC) 10/13/2017   Avulsion of left ear 08/24/2016   Multiple fractures of cervical spine (HCC) 08/24/2016   Mesenteric hematoma 08/24/2016   Serosal tear of colon 08/24/2016   Multiple abrasions 08/24/2016   Open fracture of right tibia and fibula 08/24/2016   Multiple closed tarsal fractures of left foot 08/24/2016   Acute blood loss anemia 08/24/2016   Acute urinary retention 08/24/2016   Blunt trauma 08/22/2016   Aggressive behavior      Referrals to Alternative Service(s): Referred to Alternative Service(s):   Place:   Date:   Time:    Referred to Alternative Service(s):   Place:   Date:   Time:    Referred to Alternative Service(s):   Place:   Date:   Time:    Referred to Alternative Service(s):   Place:   Date:   Time:     Alfredia FergusonDavid L Eyvette Cordon, LCAS

## 2021-08-02 NOTE — ED Notes (Signed)
Pt alert answers to yes and no question, flat affect, resting in room with television on. I will continue to monitor.
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
## 2021-08-03 MED ORDER — BENZTROPINE MESYLATE 1 MG PO TABS: 2.0000 mg | ORAL_TABLET | Freq: Two times a day (BID) | ORAL | Status: DC | PRN

## 2021-08-03 MED ORDER — DIVALPROEX SODIUM ER 500 MG PO TB24
500.0000 mg | ORAL_TABLET | Freq: Every day | ORAL | Status: DC
  Administered 2021-08-03: 500 mg via ORAL
  Filled 2021-08-03: qty 1

## 2021-08-03 MED ORDER — LORAZEPAM 1 MG PO TABS: 1.0000 mg | ORAL_TABLET | ORAL | Status: DC | PRN

## 2021-08-03 MED ORDER — ZIPRASIDONE MESYLATE 20 MG IM SOLR: 20.0000 mg | INTRAMUSCULAR | Status: DC | PRN

## 2021-08-03 MED ORDER — ARIPIPRAZOLE 10 MG PO TABS
10.0000 mg | ORAL_TABLET | Freq: Every day | ORAL | Status: DC
  Administered 2021-08-03: 10 mg via ORAL
  Filled 2021-08-03: qty 1

## 2021-08-03 MED ORDER — OLANZAPINE 5 MG PO TBDP: 5.0000 mg | ORAL_TABLET | Freq: Three times a day (TID) | ORAL | Status: DC | PRN

## 2021-08-03 NOTE — [facility] Notes (Signed)
Per Leroy Sea, patient meets criteria for inpatient treatment. There are no available or appropriate beds at [facility] today. CSW re-faxed referrals to the following facilities for review:     [facility]  Pending - Request Sent N/A [address] [place] [place] [address] [phone] --  [facility]  Pending - Request Sent N/A [address], [place] [address] [phone] --  [facility]  Pending - Request Sent N/A [address] [place] [address] [phone] --  [facility]  Pending - Request Sent N/A [address], [place] [address] [phone] --  [facility]  Pending - Request Sent N/A [facility] [address] [place] [address] [phone] --  [facility]  Pending - Request Sent N/A [address] [place] [address] [phone] --  [facility]  Pending - Request Sent N/A [address] [place] [address] [phone] --  [facility]  Pending - Request Sent N/A [address] [place], [place] [address] [phone] --  [facility]  Pending - Request Sent N/A [address] [place] [address] [phone] --  [facility]  Pending - Request Sent N/A [address], [place] [address] [phone] --  [facility]  Pending - Request Sent N/A [address] [place] [address] [phone] --  [facility]  Pending - Request Sent N/A [address], [place] [address] [phone] --  [facility]  Pending - Request Sent N/A [address] [place] [address] [phone] --  [facility]  Pending - Request  Sent N/A [address], [place] [address] [phone] --  [facility]  Pending - Request Sent N/A [address] [place] [address] [phone] --  [facility]  Pending - Request Sent N/A [address] [place] [address] [phone] --  [facility]  Pending - Request Sent N/A [address], [place] [address] [phone] --  [facility]  Pending - Request Sent N/A [address] [place] [address] [phone] --  [facility]  Pending - Request Sent N/A [address] [place] [address] [phone] --  [facility]  Pending - Request Sent N/A [address], [place] [address] [phone] --  [facility]  Pending - Request Sent N/A [address] [place] [address] [phone] --  [facility]  Pending - Request Sent N/A [address] [place], [place] [address] [phone] --  [facility]  Pending - Request Sent N/A [address] [place] [address] [phone] --  [facility]  Pending - Request Sent N/A [address] [place] [address] [phone] --  [facility]  Pending - Request Sent N/A [address] [place], [place] [address] [phone] --  [facility]  Pending - Request Sent N/A [address] [place] [address] [phone] --  [facility]  Pending - Request Sent N/A [address] [place] [address] [phone] --    TTS will continue to seek bed placement.   Crissie Reese, MSW, LCSW-A, LCAS-A Phone:  [phone] Disposition/TOC

## 2021-08-03 NOTE — ED Notes (Signed)
Pt walked to nurse's station and requested to call his mother. He did not know her phone number. I dialed her number and reached her voicemail. We did not leave a message.

## 2021-08-03 NOTE — ED Notes (Addendum)
Since 7:00 AM today, pt affect has been restricted. Pt has been calm, cooperative, and withdrawn. He is selectively mute. He reluctanly cooperated with medication administration and vitals. He spent most of the day sitting in bed and watching TV. He walked out of his room 3 times, made eye contact, and asked for graham crackers.

## 2021-08-03 NOTE — ED Provider Notes (Signed)
Emergency Medicine Observation Re-evaluation Note  David [name] is a 25 y.o. male, seen on rounds today.  Pt initially presented to the ED for complaints of Psychiatric Evaluation Currently, the patient is resting.  Physical Exam  BP 129/76 (BP Location: Right Arm)   Pulse 87   Temp 98.4 F (36.9 C) (Oral)   Resp 17   Ht 6' (1.829 m)   Wt 75.8 kg   SpO2 100%   BMI 22.66 kg/m  Physical Exam General: calm, resting Lungs: Even and unlabored Psych: Calm, NAD  ED Course / MDM  EKG:   I have reviewed the labs performed to date as well as medications administered while in observation.  Recent changes in the last 24 hours include TTS consulted, recommended inpatient treatment.  Plan  Current plan is for Social work coordinating placement as [facility] at capacity.  ATHEL MERRIWEATHER is not under involuntary commitment.     Ernie Avena, MD 08/03/21 [phone]

## 2021-08-03 NOTE — ED Notes (Signed)
Pt continues to refuse vitals

## 2021-08-03 NOTE — [facility] Notes (Signed)
Per Asher Muir, pt has been accepted to [facility]. Accepting provider is Dr. Estill Cotta. Patient can arrive 08/04/2021 after 8:00am . Number for report is [phone].  Crissie Reese, MSW, LCSW-A Phone: [phone] Disposition/TOC
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
## 2021-08-04 NOTE — ED Notes (Signed)
Pt mother was called about patient being transferred.
